# Patient Record
Sex: Female | Born: 1970
Health system: Southern US, Community
[De-identification: ages and names within clinical notes are randomized; demographics above are authoritative.]

## PROBLEM LIST (undated history)

## (undated) DIAGNOSIS — D649 Anemia, unspecified: Secondary | ICD-10-CM

## (undated) DIAGNOSIS — T4145XA Adverse effect of unspecified anesthetic, initial encounter: Secondary | ICD-10-CM

## (undated) DIAGNOSIS — E039 Hypothyroidism, unspecified: Secondary | ICD-10-CM

## (undated) DIAGNOSIS — K219 Gastro-esophageal reflux disease without esophagitis: Secondary | ICD-10-CM

## (undated) DIAGNOSIS — T8859XA Other complications of anesthesia, initial encounter: Secondary | ICD-10-CM

## (undated) HISTORY — PX: WISDOM TOOTH EXTRACTION: SHX21

## (undated) HISTORY — PX: MYOMECTOMY: SHX85

## (undated) HISTORY — PX: APPENDECTOMY: SHX54

## (undated) HISTORY — PX: OTHER SURGICAL HISTORY: SHX169

## (undated) HISTORY — PX: CHOLECYSTECTOMY: SHX55

## (undated) HISTORY — PX: ABDOMINAL HYSTERECTOMY: SHX81

## (undated) HISTORY — PX: KNEE ARTHROSCOPY: SUR90

---

## 1997-12-31 ENCOUNTER — Other Ambulatory Visit: Admission: RE | Admit: 1997-12-31 | Discharge: 1997-12-31 | Payer: Self-pay | Admitting: *Deleted

## 1998-12-29 ENCOUNTER — Other Ambulatory Visit: Admission: RE | Admit: 1998-12-29 | Discharge: 1998-12-29 | Payer: Self-pay | Admitting: Obstetrics and Gynecology

## 1999-11-07 ENCOUNTER — Inpatient Hospital Stay (HOSPITAL_COMMUNITY): Admission: EM | Admit: 1999-11-07 | Discharge: 1999-11-09 | Payer: Self-pay | Admitting: Emergency Medicine

## 1999-11-07 ENCOUNTER — Encounter: Payer: Self-pay | Admitting: Emergency Medicine

## 1999-11-07 ENCOUNTER — Encounter: Payer: Self-pay | Admitting: Internal Medicine

## 1999-12-28 ENCOUNTER — Encounter (INDEPENDENT_AMBULATORY_CARE_PROVIDER_SITE_OTHER): Payer: Self-pay

## 1999-12-28 ENCOUNTER — Other Ambulatory Visit: Admission: RE | Admit: 1999-12-28 | Discharge: 1999-12-28 | Payer: Self-pay | Admitting: *Deleted

## 2000-01-12 ENCOUNTER — Inpatient Hospital Stay (HOSPITAL_COMMUNITY): Admission: RE | Admit: 2000-01-12 | Discharge: 2000-01-14 | Payer: Self-pay | Admitting: *Deleted

## 2000-01-12 ENCOUNTER — Encounter (INDEPENDENT_AMBULATORY_CARE_PROVIDER_SITE_OTHER): Payer: Self-pay

## 2000-02-23 ENCOUNTER — Other Ambulatory Visit: Admission: RE | Admit: 2000-02-23 | Discharge: 2000-02-23 | Payer: Self-pay | Admitting: *Deleted

## 2000-03-22 ENCOUNTER — Encounter: Admission: RE | Admit: 2000-03-22 | Discharge: 2000-03-22 | Payer: Self-pay | Admitting: Orthopedic Surgery

## 2000-03-22 ENCOUNTER — Encounter: Payer: Self-pay | Admitting: Orthopedic Surgery

## 2000-10-04 ENCOUNTER — Encounter (INDEPENDENT_AMBULATORY_CARE_PROVIDER_SITE_OTHER): Payer: Self-pay | Admitting: Specialist

## 2000-10-05 ENCOUNTER — Inpatient Hospital Stay (HOSPITAL_COMMUNITY): Admission: EM | Admit: 2000-10-05 | Discharge: 2000-10-06 | Payer: Self-pay | Admitting: *Deleted

## 2002-05-12 ENCOUNTER — Other Ambulatory Visit: Admission: RE | Admit: 2002-05-12 | Discharge: 2002-05-12 | Payer: Self-pay | Admitting: *Deleted

## 2004-11-17 ENCOUNTER — Encounter: Admission: RE | Admit: 2004-11-17 | Discharge: 2004-11-17 | Payer: Self-pay | Admitting: Specialist

## 2007-02-26 ENCOUNTER — Other Ambulatory Visit: Admission: RE | Admit: 2007-02-26 | Discharge: 2007-02-26 | Payer: Self-pay | Admitting: Family Medicine

## 2007-09-13 ENCOUNTER — Encounter: Admission: RE | Admit: 2007-09-13 | Discharge: 2007-09-13 | Payer: Self-pay | Admitting: Surgery

## 2009-09-29 ENCOUNTER — Other Ambulatory Visit: Admission: RE | Admit: 2009-09-29 | Discharge: 2009-09-29 | Payer: Self-pay | Admitting: Family Medicine

## 2009-09-30 ENCOUNTER — Ambulatory Visit (HOSPITAL_COMMUNITY): Admission: RE | Admit: 2009-09-30 | Discharge: 2009-09-30 | Payer: Self-pay | Admitting: Family Medicine

## 2009-12-07 ENCOUNTER — Other Ambulatory Visit: Admission: RE | Admit: 2009-12-07 | Discharge: 2009-12-07 | Payer: Self-pay | Admitting: Family Medicine

## 2011-02-15 ENCOUNTER — Other Ambulatory Visit (HOSPITAL_COMMUNITY): Payer: Self-pay | Admitting: Family Medicine

## 2011-02-15 DIAGNOSIS — Z1231 Encounter for screening mammogram for malignant neoplasm of breast: Secondary | ICD-10-CM

## 2011-02-17 ENCOUNTER — Ambulatory Visit (HOSPITAL_COMMUNITY): Payer: 59

## 2011-02-17 NOTE — H&P (Signed)
[facility]  Patient:    Kristen Chan, Kristen Chan                          MRN: [id number] Adm. Date:  10/05/00 Attending:  Angelia Mould. Derrell Lolling, M.D. CC:         Georgina Peer, M.D.   History and Physical  CHIEF COMPLAINT:  Lower abdominal pain and nausea.  HISTORY OF PRESENT ILLNESS:  This is a 40 year old white female who is generally in excellent health.  She noted the onset of lower abdominal pain, more so in the right lower quadrant at about 9 a.m. on October 04, 2000.  She thought the pain was cramping, bothering her all day, she has some lower back pain and had nausea.  She developed some bloating and felt this was similar to the pain that she had a year ago when she had uterine fibroid problems.  She now states that she has pain across her entire lower abdomen but much more so in the right lower quadrant and that occasionally she will have brief shooting pains as well.  She has been nauseated all day.  She vomited once after drinking contrast for the CT.  No change in bowel habits.  Denies chills or fever.  I was asked to see her by ______ and Dr. Smitty Cords. Cheek in the ER.  PAST HISTORY:  She had a laparoscopic cholecystectomy by Dr. Huey Bienenstock. Gerkin in 1998.  She had a uterine myomectomy with removal of a single large fibroid by Dr. Georgina Peer in April of 2001.  She had an arthroscopy of her left knee in July of 2001.  No other medical or surgical problems.  CURRENT MEDICATIONS:  Birth control pills.  DRUG ALLERGIES:  None known.  FAMILY HISTORY:  Mother living, has a heart murmur and borderline diabetes. Father is deceased; had alcoholic cirrhosis.  Coronary artery disease in the maternal grandparents.  One brother who is living and well.  SOCIAL HISTORY:  The patient is married and has two children.  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  Denies the use of alcohol or tobacco.  REVIEW OF SYSTEMS:  All systems are reviewed and are  negative except as described above.  PHYSICAL EXAMINATION  GENERAL:  A pleasant, thin young woman who appears in moderate distress, overall appears healthy.  VITAL SIGNS:  Blood pressure 110/71, pulse 121, respirations 18, temperature 97.6.  HEENT:  Sclerae clear.  Extraocular movements intact.  Oropharynx clear.  NECK:  Supple, nontender.  No mass.  No adenopathy.  No bruit.  LUNGS:  Clear to auscultation.  HEART:  Regular rate and rhythm.  No murmur.  BREASTS:  Not examined.  ABDOMEN:  Hypoactive bowel sounds.  Slightly distended in the lower abdomen. Well-healed infraumbilical scar.  Well-healed suprapubic transverse scar.  No herniae or masses noted.  She is tender and guards across the entire lower abdomen but subjectively is more tender on the right lower quadrant.  No masses detected.  She has percussion tenderness across the entire lower abdomen, again slightly more in the right lower quadrant, not dramatically focal, however.  EXTREMITIES:  Free range of motion.  No deformity.  No edema.  Good pulses.  NEUROLOGIC:  Grossly within normal limits.  ADMISSION DATA:  White blood cell count 22,000, hemoglobin 11.3.  Complete metabolic panel normal.  CT scan suggests appendicitis.  The cecum is very low lying in the pelvis and the  appendix appears to be down in the pelvis on top of the it to the right of the uterus but appears markedly distended.  There is no free air.  There is no abscess.  This was a somewhat difficult CT scan, requiring rectal contrast and IV contrast and after a lengthy discussion with ______ , he feels that the patient has appendicitis and doubts that there is any uterine or ovarian pathology.  IMPRESSION:  Lower abdominal pain and leukocytosis.  Appendicitis seems to be the most likely diagnosis.  Other considerations would be unusual tuboovarian abscess, infarcted pedunculated uterine fibroid, unlikely, gastroenteritis, atypical diverticulitis,  etc.  PLAN:  I advised the patient and her husband that she should undergo diagnostic laparoscopy and probable appendectomy.  I have discussed the differential diagnosis with them.  Details of the procedure were discussed with them.  Risks and complications were outlined including, but not limited to, bleeding, infection, intestinal perforation, conversion to open laparotomy, injury to the gynecologic or bladder organs and other unforeseen complications.  They seem to understand all these issues well.  At this time, all of the questions are answered.  They agree with this plan. DD:  10/05/00 TD:  10/05/00 Job: [id number]

## 2011-02-17 NOTE — Op Note (Signed)
[facility]  Patient:    Kristen Chan, Kristen Chan                        MRN: [id number] Proc. Date: 01/12/00 Adm. Date:  [id number] Attending:  Pleas Koch                           Operative Report  PREOPERATIVE DIAGNOSIS:  Symptomatic uterine fibroid, menorrhagia and anemia.  POSTOPERATIVE DIAGNOSIS:  Symptomatic uterine fibroid, menorrhagia and anemia.  OPERATION:  Abdominal myomectomy, placement of Alpha 2000 catheter pain management system.  SURGEON:  Georgina Peer, M.D.  ASSISTANT:  Henreitta Leber, P.A.  ANESTHESIA:  General.  ANESTHESIOLOGIST:  Dr. Jean Rosenthal.  ESTIMATED BLOOD LOSS:  100 cc.  COMPLICATIONS:  None.  INDICATIONS:  A 40 year old gravida 2, para 2 with a symptomatic fibroid, heavy bleeding with resultant anemia.  It was brought in for myomectomy.  For details of the history, please see history and physical.  DESCRIPTION OF PROCEDURE:  The patient was taken to the operating room and given a general anesthetic, placed in dorsal lithotomy position.   After abdomen, vagina and perineum were prepped and draped, catheter placed in her bladder.  A Pfannenstiel incision was made with transverse incision through the skin taking it into the peritoneal cavity in the normal manner.  Bleeders were cauterized.  The peritoneal cavity was opened and widened.  The uterus was mobile with the posterior fibroid approximately 5 x 7 cm in diameter consistent with what the ultrasound imaged.  The uterus was elevated out of the incision.  There was no other pathology noted.  The tubes and ovaries appeared normal and were covered with moist sponges and kept irrigated.  A dilute solution of Pitressin 20 units in 100 cc total of 20 cc was injected into the posterior midline and then using the needle point Bovie cautery an incision was made in the serosa down to the fibroid.  Bleeders were cauterized.  The fibroid was isolated and dissected free by  sharp and blunt dissection, the endometrial cavity did not appear to be entered.  The myometrial defect was then closed in multiple layers using 0 and 2-0 Vicryl in interrupted figure-of-eight and mattress sutures.  The serosa was closed with an inverted baseball stitch.  Pressure was applied and there was good hemostasis.  Interceed was placed over the incision separating it from the ovaries and the bowel.  The uterus was then placed in its normal anatomic position.  All blood and debris was suction suctioned away.  Instruments were removed.  The peritoneal edges ____________ the muscle and fascia were inspected for bleeding and there was none.  The fascia was then closed with 0 Vicryl continuous from each end and tied in the middle.  Subcutaneous tissue was cauterized for any bleeding.  The Alpha 2000 catheter was placed three fingerbreadths above the right edge of the incision.  15 cc of 0.25% Marcaine was used to infiltrate the skin and subcutaneous tissues.  The skin was closed with subcuticular Dexon suture and Steri-Strips with benzoin.  The Alpha 2000 system was kept in place with Tegaderm.  Sponge, needle and instrument counts were correct.  The patient tolerated the procedure well and was transferred to the recovery area in stable condition. DD:  01/12/00 TD:  01/12/00 Job: [id number]

## 2011-02-17 NOTE — Discharge Summary (Signed)
[facility]  Patient:    Kristen Chan, Kristen Chan                        MRN: [id number] Adm. Date:  [id number] Disc. Date: 01/14/00 Attending:  Pleas Koch                           Discharge Summary  ADMITTING DIAGNOSES:          Symptomatic fibroid menorrhagia and anemia.  DISCHARGE DIAGNOSES:          Symptomatic fibroid menorrhagia and anemia.  BRIEF HISTORY:                A 40 year old gravida 2, para 2 with a history of a symptomatic 7 cm fibroid which caused heavy bleeding and resultant anemia.  The  patient underwent an abdominal myomectomy on January 12, 2000 under general anesthesia.  She did well.  There was a posterior 7 cm myoma.  HOSPITAL COURSE:              Postoperatively, the patient was doing well.  She had good urinary output and good bowel sounds.  There was some drainage from the incision which was closed with a subcuticular stitch initially.  It was thought  that this was serosanguineous and most likely due to retained irrigation fluid rom the operation.  It stopped spontaneously and the patients hemoglobin the next morning was 10.4 from a preop of 11.6.  She had good bowel sounds and on the first day she was voiding without difficulty.  She was passing flatus and advanced to a regular diet.  She was ambulating and requiring only oral pain medication.  The  ___ 2000 pump was working well in the incision. The incision was clean and dry.  Denna Haggard was negative and lungs were clear.  She was discharged on the second postoperative day in good condition, having received the maximum benefit from the hospitalization.                               She will be discharged with no heavy lifting. Darvocet-N 100 for pain, Tylenol or Advil.  No driving and to return to the office on April 16 or January 17, 2000 for ___ 2000 pump removal. DD:  01/14/00 TD:  01/14/00 Job: [id number]

## 2011-02-17 NOTE — Op Note (Signed)
[facility]  Patient:    Kristen Chan, Kristen Chan                        MRN: [id number] Proc. Date: 10/05/00 Adm. Date:  [id number] Attending:  Brandy Hale                           Operative Report  PREOPERATIVE DIAGNOSIS:  Acute appendicitis.  POSTOPERATIVE DIAGNOSIS:  Acute suppurative appendicitis.  OPERATION:  Laparoscopic appendectomy.  SURGEON:  Angelia Mould. Derrell Lolling, M.D.  OPERATIVE INDICATIONS:  This is a 40 year old, white female, with lower abdominal pain since 9 a.m. on October 04, 2000.  She has been nauseated.  She vomited once in the emergency room.  On examination, she has diffuse lower abdominal tenderness, although she localizes more to the right than to the left subjectively.  No point tenderness, however.  CT scan shows a very low lying cecum and suggest a thickened appendix in the pelvis near the uterus. There is no abscess or free air.  She is brought to the operating room for diagnostic laparoscopy and appendectomy.  OPERATIVE FINDINGS:  The appendix was acutely inflamed and had exudate on it. There was no abscess and no rupture.  The appendix was deep within the pelvis. The cecum was very low and was down essentially in the cul-de-sac near the uterus.  The uterus looked slightly enlarged but otherwise normal.  There was no obvious adnexal disease.  The small intestine and large intestine were grossly normal.  The liver and stomach looked normal.  The gallbladder was surgically absent.  OPERATIVE TECHNIQUE:  Following the induction of general endotracheal anesthesia, the patients abdomen was prepped and draped in a sterile fashion. Marcaine 0.25% with epinephrine was used as a local infiltration anesthetic. A transverse incision was made below the umbilicus through a previous laparoscopic scar.  The fascia was incised in the midline and the abdominal cavity entered under direct vision.  A 10 mm Hasson trocar was inserted  and secured with a pursestring suture of 0 Vicryl.  Pneumoperitoneum was created. A video camera was inserted with visualization and findings as described above.  The 5 mm trocar was placed into the right upper quadrant and a 12 mm trocar placed in the left lower quadrant.  The patient was placed in steep Trendelenburg which allowed [place] to mobilize the cecum and small bowel out of the pelvis.  We identified the appendix and took down some adhesions.  We encircled the appendix with a chromic Endoloop tie for mobilization.  Using the harmonic scalpel we divided some of the peritoneal attachments and fully mobilized the appendix.  The harmonic scalpel was used to divide the mesoappendix and appendiceal artery.  In this manner we dissected the appendix up and completely free of all of its attachments until we could clearly identify the junction of the appendix with the cecum.  The appendix was transected at the cecum with an Endo GIA stapling device.  The appendix was placed in a specimen bag and removed.  The operative field was copiously irrigated with saline.  We irrigated the pelvis in the right pericolic gutter and there was no bleeding whatsoever.  The staple line and the cecum looked very good.  The trocars were removed under direct vision and there was no bleeding from the trocars sites.  The pneumoperitoneum was loose.  The fascia at the umbilicus and fascia at the left  lower quadrant trocar site were closed with 0 Vicryl sutures.  The incisions were irrigated with saline, and the skin closed with subcuticular sutures of 4-0 Vicryl and Steri-Strips.  Clean bandages were placed and the patient was taken to the recovery room in stable condition.  Estimated blood loss was about 15-20 cc.  Complications none. Sponge, needle, and instrument counts were correct. DD:  10/05/00 TD:  10/05/00 Job: [id number]

## 2011-02-23 ENCOUNTER — Ambulatory Visit (HOSPITAL_COMMUNITY): Payer: 59

## 2011-03-08 ENCOUNTER — Ambulatory Visit (HOSPITAL_COMMUNITY): Admission: RE | Admit: 2011-03-08 | Payer: 59 | Source: Ambulatory Visit

## 2011-03-17 ENCOUNTER — Ambulatory Visit (HOSPITAL_COMMUNITY): Payer: 59

## 2011-03-17 ENCOUNTER — Ambulatory Visit (HOSPITAL_COMMUNITY)
Admission: RE | Admit: 2011-03-17 | Discharge: 2011-03-17 | Disposition: A | Discharge status: Home / Self Care | Payer: PRIVATE HEALTH INSURANCE | Source: Ambulatory Visit | Attending: Family Medicine | Admitting: Family Medicine

## 2011-03-17 DIAGNOSIS — Z1231 Encounter for screening mammogram for malignant neoplasm of breast: Secondary | ICD-10-CM

## 2012-02-27 ENCOUNTER — Other Ambulatory Visit (HOSPITAL_COMMUNITY): Payer: Self-pay | Admitting: Family Medicine

## 2012-02-27 DIAGNOSIS — Z1231 Encounter for screening mammogram for malignant neoplasm of breast: Secondary | ICD-10-CM

## 2012-03-20 ENCOUNTER — Ambulatory Visit (HOSPITAL_COMMUNITY): Payer: PRIVATE HEALTH INSURANCE

## 2012-04-03 ENCOUNTER — Other Ambulatory Visit: Payer: Self-pay | Admitting: Family Medicine

## 2012-04-03 ENCOUNTER — Other Ambulatory Visit (HOSPITAL_COMMUNITY)
Admission: RE | Admit: 2012-04-03 | Discharge: 2012-04-03 | Disposition: A | Discharge status: Home / Self Care | Payer: 59 | Source: Ambulatory Visit | Attending: Family Medicine | Admitting: Family Medicine

## 2012-04-03 DIAGNOSIS — Z124 Encounter for screening for malignant neoplasm of cervix: Secondary | ICD-10-CM | POA: Insufficient documentation

## 2012-04-22 ENCOUNTER — Ambulatory Visit (HOSPITAL_COMMUNITY)
Admission: RE | Admit: 2012-04-22 | Discharge: 2012-04-22 | Disposition: A | Discharge status: Home / Self Care | Payer: 59 | Source: Ambulatory Visit | Attending: Family Medicine | Admitting: Family Medicine

## 2012-04-22 DIAGNOSIS — Z1231 Encounter for screening mammogram for malignant neoplasm of breast: Secondary | ICD-10-CM | POA: Insufficient documentation

## 2012-04-24 ENCOUNTER — Other Ambulatory Visit: Payer: Self-pay | Admitting: Family Medicine

## 2012-04-24 DIAGNOSIS — R928 Other abnormal and inconclusive findings on diagnostic imaging of breast: Secondary | ICD-10-CM

## 2012-04-30 ENCOUNTER — Ambulatory Visit
Admission: RE | Admit: 2012-04-30 | Discharge: 2012-04-30 | Disposition: A | Discharge status: Home / Self Care | Payer: 59 | Source: Ambulatory Visit | Attending: Family Medicine | Admitting: Family Medicine

## 2012-04-30 DIAGNOSIS — R928 Other abnormal and inconclusive findings on diagnostic imaging of breast: Secondary | ICD-10-CM

## 2013-05-09 ENCOUNTER — Other Ambulatory Visit: Payer: Self-pay | Admitting: Obstetrics and Gynecology

## 2014-05-14 ENCOUNTER — Other Ambulatory Visit: Payer: Self-pay | Admitting: *Deleted

## 2014-05-14 ENCOUNTER — Ambulatory Visit (INDEPENDENT_AMBULATORY_CARE_PROVIDER_SITE_OTHER): Payer: 59 | Admitting: Physician Assistant

## 2014-05-14 DIAGNOSIS — R079 Chest pain, unspecified: Secondary | ICD-10-CM

## 2014-05-14 NOTE — Progress Notes (Signed)
Exercise Treadmill Test  Kristen Chan is a 42 y.o. female non-smoker with a FHx of CAD referred by PCP for ETT due to recent hx of R sided chest discomfort occuring at rest with assoc jaw pain and lightheadedness.  No exertional chest pain, dyspnea, syncope.  Exam unremarkable.  ECG without ST changes.  Pre-Exercise Testing Evaluation Rhythm: normal sinus  Rate: 88 bpm     Test  Exercise Tolerance Test Ordering MD: Cristopher Peru, MD  Interpreting MD: Richardson Dopp, PA-C  Unique Test No: 1  Treadmill:  1  Indication for ETT: chest pain - rule out ischemia  Contraindication to ETT: No   Stress Modality: exercise - treadmill  Cardiac Imaging Performed: non   Protocol: standard Bruce - maximal  Max BP:  169/78  Max MPHR (bpm):  175 85% MPR (bpm):  149  MPHR obtained (bpm):  171 % MPHR obtained:  97  Reached 85% MPHR (min:sec):  4:06 Total Exercise Time (min-sec):  8:00  Workload in METS:  10.0 Borg Scale: 13  Reason ETT Terminated:  desired heart rate attained    ST Segment Analysis At Rest: normal ST segments - no evidence of significant ST depression With Exercise: no evidence of significant ST depression  Other Information Arrhythmia:  No Angina during ETT:  absent (0) Quality of ETT:  diagnostic  ETT Interpretation:  normal - no evidence of ischemia by ST analysis  Comments: Good exercise capacity. No chest pain. Normal BP response to exercise. No ST  changes to suggest ischemia.   Recommendations: FU with PCP as directed. Signed,  Richardson Dopp, PA-C   05/14/2014 10:06 AM

## 2015-02-12 ENCOUNTER — Other Ambulatory Visit: Payer: Self-pay | Admitting: Obstetrics and Gynecology

## 2015-02-22 ENCOUNTER — Encounter (HOSPITAL_COMMUNITY)
Admission: RE | Admit: 2015-02-22 | Discharge: 2015-02-22 | Disposition: A | Discharge status: Home / Self Care | Payer: 59 | Source: Ambulatory Visit | Attending: Obstetrics and Gynecology | Admitting: Obstetrics and Gynecology

## 2015-02-22 ENCOUNTER — Encounter (HOSPITAL_COMMUNITY): Payer: Self-pay

## 2015-02-22 DIAGNOSIS — D259 Leiomyoma of uterus, unspecified: Secondary | ICD-10-CM | POA: Diagnosis not present

## 2015-02-22 DIAGNOSIS — Z9049 Acquired absence of other specified parts of digestive tract: Secondary | ICD-10-CM | POA: Diagnosis not present

## 2015-02-22 DIAGNOSIS — E039 Hypothyroidism, unspecified: Secondary | ICD-10-CM | POA: Diagnosis not present

## 2015-02-22 DIAGNOSIS — Z880 Allergy status to penicillin: Secondary | ICD-10-CM | POA: Diagnosis not present

## 2015-02-22 DIAGNOSIS — N800 Endometriosis of uterus: Secondary | ICD-10-CM | POA: Diagnosis not present

## 2015-02-22 DIAGNOSIS — K219 Gastro-esophageal reflux disease without esophagitis: Secondary | ICD-10-CM | POA: Diagnosis not present

## 2015-02-22 DIAGNOSIS — Z882 Allergy status to sulfonamides status: Secondary | ICD-10-CM | POA: Diagnosis not present

## 2015-02-22 HISTORY — DX: Other complications of anesthesia, initial encounter: T88.59XA

## 2015-02-22 HISTORY — DX: Adverse effect of unspecified anesthetic, initial encounter: T41.45XA

## 2015-02-22 HISTORY — DX: Anemia, unspecified: D64.9

## 2015-02-22 HISTORY — DX: Gastro-esophageal reflux disease without esophagitis: K21.9

## 2015-02-22 HISTORY — DX: Hypothyroidism, unspecified: E03.9

## 2015-02-22 LAB — CBC
HEMATOCRIT: 40.9 % (ref 36.0–46.0)
HEMOGLOBIN: 13.8 g/dL (ref 12.0–15.0)
MCH: 32.3 pg (ref 26.0–34.0)
MCHC: 33.7 g/dL (ref 30.0–36.0)
MCV: 95.8 fL (ref 78.0–100.0)
PLATELETS: 334 10*3/uL (ref 150–400)
RBC: 4.27 MIL/uL (ref 3.87–5.11)
RDW: 13.2 % (ref 11.5–15.5)
WBC: 7.5 10*3/uL (ref 4.0–10.5)

## 2015-02-22 NOTE — H&P (Signed)
44 y.o. yo complains of symptomatic fibroid uterus.  Previously:Pt. c/o intermittent sharp stabbing R&LLQ pain started 5 weeks ago, has hx of ovarian cysts; also c/o spotting and nausea, bloating, constipation.  US showed 8x8x7 with at least 5 fibroids 1-4 cm. EM 0.3. RO 2 cm and LO not seen.  Pt has been suffering with pain since starting OCPs- still has pain during when cycle "should be on" despite continuous. Then a month ago began bleeding again despite not missing pills. Fibroids seem to have returned and not responding to therapy. Pt desires definitive after all options discussed. Vulvar area flares on occ and clobetasol for a day takes care of it. Current US shows uterus 9x8x7, multiple fibroids. EM 0.3 cm; normal R ovary, LO not seen.  Past Medical History  Diagnosis Date  . Complication of anesthesia     patient requests lightweight anesthesia  . Hypothyroidism   . GERD (gastroesophageal reflux disease)   . Anemia     history of anemia   Past Surgical History  Procedure Laterality Date  . Cholecystectomy    . Appendectomy    . Myomectomy    . Knee arthroscopy    . Thumb joint stabiliztion    . Wisdom tooth extraction      History   Social History  . Marital Status: Married    Spouse Name: N/A  . Number of Children: N/A  . Years of Education: N/A   Occupational History  . Not on file.   Social History Main Topics  . Smoking status: Never Smoker   . Smokeless tobacco: Never Used  . Alcohol Use: No  . Drug Use: No  . Sexual Activity: Not on file   Other Topics Concern  . Not on file   Social History Narrative  . No narrative on file    No current facility-administered medications on file prior to encounter.   No current outpatient prescriptions on file prior to encounter.    Allergies  Allergen Reactions  . Artichoke [Cynara Scolymus (Artichoke)] Itching and Swelling    Throat swelling and itching, caused by severe poison ivy allergy.  Luretha Rued Flavor  Itching and Swelling    Throat swelling and itching, caused by severe poison ivy allergy.   . Penicillins Rash  . Sulfa Antibiotics Rash    @VITALS2 @  Lungs: clear to ascultation Cor:  RRR Abdomen:  soft, nontender, nondistended. Ex:  no cords, erythema Pelvic:  Female Genitalia: Vulva: no masses, atrophy, or lesions; no redness in area of itching. Vagina: no tenderness, erythema, cystocele, rectocele, abnormal vaginal discharge, or vesicle(s) or ulcers. Cervix: no discharge or cervical motion tenderness and grossly normal. Uterus: normal shape and size 10 and non-tender, no uterine prolapse, and retroverted. Bladder/Urethra: no urethral discharge or mass and normal meatus, bladder non distended, and Urethra well supported. Adnexa/Parametria: no parametrial tenderness or mass and no adnexal tenderness or ovarian mass.  A:  Symptomatic fibroid uterus.  For robotic TLH/salpingectomies/possible BSO- pt desires to lean toward taking ovaries and will do so if any endometriosis or cysts.  Pt desires Ultram for post op pain.   P: All risks, benefits and alternatives d/w patient and she desires to proceed.  Patient has undergone a modified bowel prep and will receive preop antibiotics and SCDs during the operation.     Halyn Flaugher A

## 2015-02-22 NOTE — Patient Instructions (Signed)
Your procedure is scheduled on:  Wednesday, Feb 24, 2015  Enter through the [employer] of [facility] at:  6:00 A.M.  Pick up the phone at the desk and dial [phone].  Call this number if you have problems the morning of surgery: [phone].  Remember: Do NOT eat food or drink after:  AFTER MIDNIGHT TUESDAY Take these medicines the morning of surgery with a SIP OF WATER: LEVOTHYROXINE, OMEPRAZOLE  Do NOT wear jewelry (body piercing), metal hair clips/bobby pins, make-up, or nail polish. Do NOT wear lotions, powders, or perfumes.  You may wear deoderant. Do NOT shave for 48 hours prior to surgery. Do NOT bring valuables to the hospital. Contacts, dentures, or bridgework may not be worn into surgery. Leave suitcase in car.  After surgery it may be brought to your room.  For patients admitted to the hospital, checkout time is 11:00 AM the day of discharge.

## 2015-02-23 MED ORDER — CIPROFLOXACIN IN D5W 400 MG/200ML IV SOLN
400.0000 mg | INTRAVENOUS | Status: AC
  Administered 2015-02-24: 400 mg via INTRAVENOUS
  Filled 2015-02-23: qty 200

## 2015-02-23 MED ORDER — METRONIDAZOLE IN NACL 5-0.79 MG/ML-% IV SOLN
500.0000 mg | INTRAVENOUS | Status: AC
  Administered 2015-02-24: 500 mg via INTRAVENOUS
  Filled 2015-02-23: qty 100

## 2015-02-24 ENCOUNTER — Encounter (HOSPITAL_COMMUNITY): Payer: Self-pay | Admitting: Anesthesiology

## 2015-02-24 ENCOUNTER — Ambulatory Visit (HOSPITAL_COMMUNITY): Payer: 59 | Admitting: Anesthesiology

## 2015-02-24 ENCOUNTER — Ambulatory Visit (HOSPITAL_COMMUNITY)
Admission: RE | Admit: 2015-02-24 | Discharge: 2015-02-25 | Disposition: A | Discharge status: Home / Self Care | Payer: 59 | Source: Ambulatory Visit | Attending: Obstetrics and Gynecology | Admitting: Obstetrics and Gynecology

## 2015-02-24 ENCOUNTER — Encounter (HOSPITAL_COMMUNITY)
Admission: RE | Disposition: A | Discharge status: Home / Self Care | Payer: Self-pay | Source: Ambulatory Visit | Attending: Obstetrics and Gynecology

## 2015-02-24 DIAGNOSIS — Z9049 Acquired absence of other specified parts of digestive tract: Secondary | ICD-10-CM | POA: Insufficient documentation

## 2015-02-24 DIAGNOSIS — D259 Leiomyoma of uterus, unspecified: Secondary | ICD-10-CM | POA: Insufficient documentation

## 2015-02-24 DIAGNOSIS — Z880 Allergy status to penicillin: Secondary | ICD-10-CM | POA: Insufficient documentation

## 2015-02-24 DIAGNOSIS — Z882 Allergy status to sulfonamides status: Secondary | ICD-10-CM | POA: Insufficient documentation

## 2015-02-24 DIAGNOSIS — E039 Hypothyroidism, unspecified: Secondary | ICD-10-CM | POA: Insufficient documentation

## 2015-02-24 DIAGNOSIS — Z9889 Other specified postprocedural states: Secondary | ICD-10-CM

## 2015-02-24 DIAGNOSIS — N800 Endometriosis of uterus: Secondary | ICD-10-CM | POA: Insufficient documentation

## 2015-02-24 DIAGNOSIS — K219 Gastro-esophageal reflux disease without esophagitis: Secondary | ICD-10-CM | POA: Insufficient documentation

## 2015-02-24 HISTORY — PX: ROBOTIC ASSISTED BILATERAL SALPINGO OOPHERECTOMY: SHX6078

## 2015-02-24 HISTORY — PX: CYSTOSCOPY: SHX5120

## 2015-02-24 LAB — PREGNANCY, URINE: Preg Test, Ur: NEGATIVE

## 2015-02-24 SURGERY — ROBOTIC ASSISTED BILATERAL SALPINGO OOPHORECTOMY
Anesthesia: General | Site: Urethra

## 2015-02-24 MED ORDER — MIDAZOLAM HCL 2 MG/2ML IJ SOLN
INTRAMUSCULAR | Status: DC | PRN
  Administered 2015-02-24: 2 mg via INTRAVENOUS

## 2015-02-24 MED ORDER — GLYCOPYRROLATE 0.2 MG/ML IJ SOLN
INTRAMUSCULAR | Status: AC
  Filled 2015-02-24: qty 4

## 2015-02-24 MED ORDER — ONDANSETRON HCL 4 MG PO TABS: 4.0000 mg | ORAL_TABLET | Freq: Four times a day (QID) | ORAL | Status: DC | PRN

## 2015-02-24 MED ORDER — STERILE WATER FOR IRRIGATION IR SOLN
Status: DC | PRN
  Administered 2015-02-24: 1000 mL
  Administered 2015-02-24: 3000 mL

## 2015-02-24 MED ORDER — FENTANYL CITRATE (PF) 250 MCG/5ML IJ SOLN
INTRAMUSCULAR | Status: AC
  Filled 2015-02-24: qty 5

## 2015-02-24 MED ORDER — GLYCOPYRROLATE 0.2 MG/ML IJ SOLN
INTRAMUSCULAR | Status: AC
  Filled 2015-02-24: qty 1

## 2015-02-24 MED ORDER — PROPOFOL 10 MG/ML IV BOLUS
INTRAVENOUS | Status: AC
  Filled 2015-02-24: qty 20

## 2015-02-24 MED ORDER — TRAMADOL HCL 50 MG PO TABS
50.0000 mg | ORAL_TABLET | Freq: Four times a day (QID) | ORAL | Status: DC | PRN
  Administered 2015-02-24 – 2015-02-25 (×4): 50 mg via ORAL
  Filled 2015-02-24 (×4): qty 1

## 2015-02-24 MED ORDER — MEPERIDINE HCL 25 MG/ML IJ SOLN: 6.2500 mg | INTRAMUSCULAR | Status: DC | PRN

## 2015-02-24 MED ORDER — LACTATED RINGERS IV SOLN
INTRAVENOUS | Status: DC
  Administered 2015-02-24 (×2): via INTRAVENOUS

## 2015-02-24 MED ORDER — HYDROMORPHONE HCL 1 MG/ML IJ SOLN
INTRAMUSCULAR | Status: AC
  Filled 2015-02-24: qty 1

## 2015-02-24 MED ORDER — DEXAMETHASONE SODIUM PHOSPHATE 4 MG/ML IJ SOLN
INTRAMUSCULAR | Status: AC
Start: 2015-02-24 — End: 2015-02-24
  Filled 2015-02-24: qty 1

## 2015-02-24 MED ORDER — FENTANYL CITRATE (PF) 100 MCG/2ML IJ SOLN
INTRAMUSCULAR | Status: DC | PRN
  Administered 2015-02-24: 100 ug via INTRAVENOUS
  Administered 2015-02-24 (×3): 50 ug via INTRAVENOUS

## 2015-02-24 MED ORDER — IBUPROFEN 800 MG PO TABS
800.0000 mg | ORAL_TABLET | Freq: Three times a day (TID) | ORAL | Status: DC | PRN
  Administered 2015-02-24 – 2015-02-25 (×3): 800 mg via ORAL
  Filled 2015-02-24 (×3): qty 1

## 2015-02-24 MED ORDER — GLYCOPYRROLATE 0.2 MG/ML IJ SOLN
INTRAMUSCULAR | Status: DC | PRN
  Administered 2015-02-24: 0.2 mg via INTRAVENOUS
  Administered 2015-02-24: .8 mg via INTRAVENOUS

## 2015-02-24 MED ORDER — METOCLOPRAMIDE HCL 5 MG/ML IJ SOLN
INTRAMUSCULAR | Status: AC
  Filled 2015-02-24: qty 2

## 2015-02-24 MED ORDER — PROPOFOL 10 MG/ML IV BOLUS
INTRAVENOUS | Status: DC | PRN
  Administered 2015-02-24: 150 mg via INTRAVENOUS

## 2015-02-24 MED ORDER — ONDANSETRON HCL 4 MG/2ML IJ SOLN
4.0000 mg | Freq: Four times a day (QID) | INTRAMUSCULAR | Status: DC | PRN
  Administered 2015-02-24: 4 mg via INTRAVENOUS
  Filled 2015-02-24: qty 2

## 2015-02-24 MED ORDER — NEOSTIGMINE METHYLSULFATE 10 MG/10ML IV SOLN
INTRAVENOUS | Status: DC | PRN
  Administered 2015-02-24: 5 mg via INTRAVENOUS

## 2015-02-24 MED ORDER — ROCURONIUM BROMIDE 100 MG/10ML IV SOLN
INTRAVENOUS | Status: AC
  Filled 2015-02-24: qty 1

## 2015-02-24 MED ORDER — KETOROLAC TROMETHAMINE 30 MG/ML IJ SOLN
INTRAMUSCULAR | Status: DC | PRN
  Administered 2015-02-24: 30 mg via INTRAVENOUS

## 2015-02-24 MED ORDER — LEVOTHYROXINE SODIUM 50 MCG PO TABS
50.0000 ug | ORAL_TABLET | Freq: Every day | ORAL | Status: DC
  Administered 2015-02-25: 50 ug via ORAL
  Filled 2015-02-24: qty 1

## 2015-02-24 MED ORDER — ROCURONIUM BROMIDE 100 MG/10ML IV SOLN
INTRAVENOUS | Status: DC | PRN
  Administered 2015-02-24: 50 mg via INTRAVENOUS

## 2015-02-24 MED ORDER — KETOROLAC TROMETHAMINE 30 MG/ML IJ SOLN: 30.0000 mg | Freq: Four times a day (QID) | INTRAMUSCULAR | Status: DC

## 2015-02-24 MED ORDER — ONDANSETRON HCL 4 MG/2ML IJ SOLN
INTRAMUSCULAR | Status: AC
  Filled 2015-02-24: qty 2

## 2015-02-24 MED ORDER — ONDANSETRON HCL 4 MG/2ML IJ SOLN
INTRAMUSCULAR | Status: DC | PRN
  Administered 2015-02-24: 4 mg via INTRAVENOUS

## 2015-02-24 MED ORDER — SCOPOLAMINE 1 MG/3DAYS TD PT72
1.0000 | MEDICATED_PATCH | Freq: Once | TRANSDERMAL | Status: DC
  Administered 2015-02-24: 1.5 mg via TRANSDERMAL

## 2015-02-24 MED ORDER — ARTIFICIAL TEARS OP OINT
TOPICAL_OINTMENT | OPHTHALMIC | Status: AC
  Filled 2015-02-24: qty 3.5

## 2015-02-24 MED ORDER — BUPIVACAINE LIPOSOME 1.3 % IJ SUSP
20.0000 mL | Freq: Once | INTRAMUSCULAR | Status: AC
  Administered 2015-02-24: 20 mL
  Filled 2015-02-24: qty 20

## 2015-02-24 MED ORDER — SODIUM CHLORIDE 0.9 % IJ SOLN
INTRAMUSCULAR | Status: AC
  Filled 2015-02-24: qty 50

## 2015-02-24 MED ORDER — LACTATED RINGERS IV SOLN
INTRAVENOUS | Status: DC
  Administered 2015-02-24: 13:00:00 via INTRAVENOUS

## 2015-02-24 MED ORDER — DEXAMETHASONE SODIUM PHOSPHATE 10 MG/ML IJ SOLN
INTRAMUSCULAR | Status: DC | PRN
  Administered 2015-02-24: 4 mg via INTRAVENOUS

## 2015-02-24 MED ORDER — KETOROLAC TROMETHAMINE 30 MG/ML IJ SOLN
INTRAMUSCULAR | Status: AC
  Filled 2015-02-24: qty 1

## 2015-02-24 MED ORDER — METOCLOPRAMIDE HCL 5 MG/ML IJ SOLN
10.0000 mg | Freq: Once | INTRAMUSCULAR | Status: AC | PRN
  Administered 2015-02-24: 10 mg via INTRAVENOUS

## 2015-02-24 MED ORDER — HYDROMORPHONE HCL 1 MG/ML IJ SOLN
0.2500 mg | INTRAMUSCULAR | Status: DC | PRN
  Administered 2015-02-24 (×4): 0.5 mg via INTRAVENOUS

## 2015-02-24 MED ORDER — LACTATED RINGERS IR SOLN
Status: DC | PRN
  Administered 2015-02-24: 3000 mL

## 2015-02-24 MED ORDER — NEOSTIGMINE METHYLSULFATE 10 MG/10ML IV SOLN
INTRAVENOUS | Status: AC
  Filled 2015-02-24: qty 1

## 2015-02-24 MED ORDER — MENTHOL 3 MG MT LOZG: 1.0000 | LOZENGE | OROMUCOSAL | Status: DC | PRN

## 2015-02-24 MED ORDER — PANTOPRAZOLE SODIUM 40 MG PO TBEC
40.0000 mg | DELAYED_RELEASE_TABLET | Freq: Every day | ORAL | Status: DC
  Administered 2015-02-25: 40 mg via ORAL
  Filled 2015-02-24: qty 1

## 2015-02-24 MED ORDER — LIDOCAINE HCL (CARDIAC) 20 MG/ML IV SOLN
INTRAVENOUS | Status: AC
  Filled 2015-02-24: qty 5

## 2015-02-24 MED ORDER — SCOPOLAMINE 1 MG/3DAYS TD PT72
MEDICATED_PATCH | TRANSDERMAL | Status: AC
  Administered 2015-02-24: 1.5 mg via TRANSDERMAL
  Filled 2015-02-24: qty 1

## 2015-02-24 MED ORDER — LORATADINE 10 MG PO TABS
10.0000 mg | ORAL_TABLET | Freq: Every day | ORAL | Status: DC
  Administered 2015-02-25: 10 mg via ORAL
  Filled 2015-02-24 (×2): qty 1

## 2015-02-24 MED ORDER — HYDROMORPHONE HCL 1 MG/ML IJ SOLN
0.2500 mg | INTRAMUSCULAR | Status: DC | PRN
  Administered 2015-02-24 (×2): 0.5 mg via INTRAVENOUS

## 2015-02-24 MED ORDER — MIDAZOLAM HCL 2 MG/2ML IJ SOLN
INTRAMUSCULAR | Status: AC
  Filled 2015-02-24: qty 2

## 2015-02-24 MED ORDER — LIDOCAINE HCL (CARDIAC) 20 MG/ML IV SOLN
INTRAVENOUS | Status: DC | PRN
  Administered 2015-02-24: 50 mg via INTRAVENOUS

## 2015-02-24 SURGICAL SUPPLY — 75 items
BARRIER ADHS 3X4 INTERCEED (GAUZE/BANDAGES/DRESSINGS) ×3 IMPLANT
BENZOIN TINCTURE PRP APPL 2/3 (GAUZE/BANDAGES/DRESSINGS) ×3 IMPLANT
CHLORAPREP W/TINT 26ML (MISCELLANEOUS) ×3 IMPLANT
CLOTH BEACON ORANGE TIMEOUT ST (SAFETY) ×3 IMPLANT
CONT PATH 16OZ SNAP LID 3702 (MISCELLANEOUS) ×3 IMPLANT
COVER BACK TABLE 60X90IN (DRAPES) ×6 IMPLANT
COVER TIP SHEARS 8 DVNC (MISCELLANEOUS) ×2 IMPLANT
COVER TIP SHEARS 8MM DA VINCI (MISCELLANEOUS) ×1
DECANTER SPIKE VIAL GLASS SM (MISCELLANEOUS) ×3 IMPLANT
DRAPE WARM FLUID 44X44 (DRAPE) ×3 IMPLANT
DRSG COVADERM PLUS 2X2 (GAUZE/BANDAGES/DRESSINGS) IMPLANT
DRSG OPSITE POSTOP 3X4 (GAUZE/BANDAGES/DRESSINGS) ×3 IMPLANT
DURAPREP 26ML APPLICATOR (WOUND CARE) ×3 IMPLANT
ELECT REM PT RETURN 9FT ADLT (ELECTROSURGICAL) ×3
ELECTRODE REM PT RTRN 9FT ADLT (ELECTROSURGICAL) ×2 IMPLANT
GAUZE VASELINE 3X9 (GAUZE/BANDAGES/DRESSINGS) IMPLANT
GLOVE BIO SURGEON STRL SZ 6.5 (GLOVE) ×3 IMPLANT
GLOVE BIO SURGEON STRL SZ7 (GLOVE) ×6 IMPLANT
GLOVE BIOGEL PI IND STRL 6.5 (GLOVE) ×2 IMPLANT
GLOVE BIOGEL PI IND STRL 7.0 (GLOVE) ×4 IMPLANT
GLOVE BIOGEL PI INDICATOR 6.5 (GLOVE) ×1
GLOVE BIOGEL PI INDICATOR 7.0 (GLOVE) ×2
GLOVE ECLIPSE 6.5 STRL STRAW (GLOVE) ×9 IMPLANT
GRASPER BIPOLAR FEN DA VINCI (INSTRUMENTS)
GRASPER BPLR FEN DVNC (INSTRUMENTS) IMPLANT
GYRUS RUMI II 2.5CM BLUE (DISPOSABLE)
GYRUS RUMI II 3.5CM BLUE (DISPOSABLE) ×3
GYRUS RUMI II 4.0CM BLUE (DISPOSABLE)
KIT ACCESSORY DA VINCI DISP (KITS) ×1
KIT ACCESSORY DVNC DISP (KITS) ×2 IMPLANT
LEGGING LITHOTOMY PAIR STRL (DRAPES) ×3 IMPLANT
LIQUID BAND (GAUZE/BANDAGES/DRESSINGS) ×3 IMPLANT
MANIPULATOR UTERINE 4.5 ZUMI (MISCELLANEOUS) IMPLANT
NEEDLE INSUFFLATION 120MM (ENDOMECHANICALS) ×3 IMPLANT
NS IRRIG 1000ML POUR BTL (IV SOLUTION) ×9 IMPLANT
OCCLUDER COLPOPNEUMO (BALLOONS) ×6 IMPLANT
PACK ROBOT WH (CUSTOM PROCEDURE TRAY) ×3 IMPLANT
PACK ROBOTIC GOWN (GOWN DISPOSABLE) ×3 IMPLANT
PAD POSITIONER PINK NONSTERILE (MISCELLANEOUS) ×3 IMPLANT
PAD PREP 24X48 CUFFED NSTRL (MISCELLANEOUS) ×6 IMPLANT
RUMI II 3.0CM BLUE KOH-EFFICIE (DISPOSABLE) IMPLANT
RUMI II GYRUS 2.5CM BLUE (DISPOSABLE) IMPLANT
RUMI II GYRUS 3.5CM BLUE (DISPOSABLE) ×2 IMPLANT
RUMI II GYRUS 4.0CM BLUE (DISPOSABLE) IMPLANT
SET CYSTO W/LG BORE CLAMP LF (SET/KITS/TRAYS/PACK) IMPLANT
SET IRRIG TUBING LAPAROSCOPIC (IRRIGATION / IRRIGATOR) ×3 IMPLANT
SET TRI-LUMEN FLTR TB AIRSEAL (TUBING) ×3 IMPLANT
STRIP CLOSURE SKIN 1/2X4 (GAUZE/BANDAGES/DRESSINGS) ×3 IMPLANT
SUT DVC VLOC 180 0 12IN GS21 (SUTURE)
SUT VIC AB 0 CT1 27 (SUTURE) ×5
SUT VIC AB 0 CT1 27XBRD ANBCTR (SUTURE) ×10 IMPLANT
SUT VIC AB 2-0 CT2 27 (SUTURE) ×6 IMPLANT
SUT VIC AB 2-0 UR6 27 (SUTURE) ×3 IMPLANT
SUT VICRYL 0 UR6 27IN ABS (SUTURE) ×6 IMPLANT
SUT VICRYL RAPIDE 3 0 (SUTURE) ×6 IMPLANT
SUT VLOC 180 0 9IN  GS21 (SUTURE) ×1
SUT VLOC 180 0 9IN GS21 (SUTURE) ×2 IMPLANT
SUTURE DVC VLC 180 0 12IN GS21 (SUTURE) IMPLANT
SYR 50ML LL SCALE MARK (SYRINGE) ×3 IMPLANT
SYSTEM CONVERTIBLE TROCAR (TROCAR) IMPLANT
TIP RUMI ORANGE 6.7MMX12CM (TIP) IMPLANT
TIP UTERINE 5.1X6CM LAV DISP (MISCELLANEOUS) IMPLANT
TIP UTERINE 6.7X10CM GRN DISP (MISCELLANEOUS) ×3 IMPLANT
TIP UTERINE 6.7X6CM WHT DISP (MISCELLANEOUS) IMPLANT
TIP UTERINE 6.7X8CM BLUE DISP (MISCELLANEOUS) IMPLANT
TOWEL OR 17X24 6PK STRL BLUE (TOWEL DISPOSABLE) ×6 IMPLANT
TRAY FOLEY CATH SILVER 14FR (SET/KITS/TRAYS/PACK) ×3 IMPLANT
TROCAR DILATING TIP 12MM 150MM (ENDOMECHANICALS) ×3 IMPLANT
TROCAR DISP BLADELESS 8 DVNC (TROCAR) ×2 IMPLANT
TROCAR DISP BLADELESS 8MM (TROCAR) ×1
TROCAR OPTI TIP 12M 100M (ENDOMECHANICALS) IMPLANT
TROCAR PORT AIRSEAL 5X120 (TROCAR) ×3 IMPLANT
TROCAR XCEL 12X100 BLDLESS (ENDOMECHANICALS) ×3 IMPLANT
TROCAR XCEL NON-BLD 5MMX100MML (ENDOMECHANICALS) ×3 IMPLANT
WATER STERILE IRR 1000ML POUR (IV SOLUTION) ×9 IMPLANT

## 2015-02-24 NOTE — Brief Op Note (Signed)
02/24/2015  9:12 AM  PATIENT:  Kristen Chan  44 y.o. female  PRE-OPERATIVE DIAGNOSIS:  FIBROIDS  POST-OPERATIVE DIAGNOSIS:  Fibroids  PROCEDURE:  Procedure(s): ROBOTIC ASSISTED BILATERAL SALPINGO OOPHORECTOMY (N/A) CYSTOSCOPY (N/A)  SURGEON:  Surgeon(s) and Role:    * Bobbye Charleston, MD - Primary    * Allyn Kenner, DO - Assisting  ANESTHESIA:   general  EBL:  Total I/O In: 1000 [I.V.:1000] Out: 600 [Urine:500; Blood:100]  LOCAL MEDICATIONS USED:  OTHER Exparel  SPECIMEN:  Source of Specimen:  uterus, cervix, bilateral ovaries and tubes  DISPOSITION OF SPECIMEN:  PATHOLOGY  COUNTS:  YES  TOURNIQUET:  * No tourniquets in log *  DICTATION: .Note written in EPIC  PLAN OF CARE: Admit for overnight observation  PATIENT DISPOSITION:  PACU - hemodynamically stable.   Delay start of Pharmacological VTE agent (>24hrs) due to surgical blood loss or risk of bleeding: not applicable  Complications:  None.  Findings: 12 weeks size uterus with multiple fibroids.  Ovaries were normal bilaterally but adhesed.  No evidence of endometriosis.  The ureters were identified during multiple points of the case and were always out of the field of dissection.  On cystoscopy, the bladder was intact and bilateral spill was seen from each ureteral oriface.    Medications: Exparel  intra peritoneal.  Technique:  After adequate anesthesia was achieved the patient was positioned, prepped and draped in usual sterile fashion.  A speculum was placed in the vagina and the cervix dilated with pratt dilators.  The 10 cm Rumi and 3.5 cm Koh ring were assembled and placed in proper fashion.  The  Speculum was removed and the bladder catheterized with a foley.    Attention was turned to the abdomen where a 1 cm incision was made above the umbilicus.  The veress needle was inserted without aspiration of bowel contents or blood.  The long 12 mm trocar was placed and the other three trocar sites were  marked out, all approximately 10 cm from each other and the camera. Two 8.5 mm trocars were placed on either side of the camera port.  A 5 mm airseal assistant port was placed 32 cm above the level of the plane of the other trocars on the L.  All trocars were inserted under direct visualization of the camera.  The patient was placed in trendelenburg and then the Robot docked.  The PK forceps were placed on arm 2 and the Hot shears on arm 1 and introduced under direct visualization of the camera.   I then broke scrub and sat down at the console.  The above findings were noted and the ureters identified well out of the field of dissection.  The right round ligament was identified and cauterized with the PK.  It  was then divided with the PK cautery and shears.  The peritoneum parallel to the IP ligament was carefully incised and the posterior broad ligament was then incised under the right IP ligament with care to be above the ureter.  The ureters were dissected out to the level of the uterine arteries bilaterally and the uterine arteries were cauterized above the ureter. The R IP ligament was cauterized with the PK and then incised with the shears.  The Broad and cardinal ligaments were then cauterized against the cervix to the level of the Koh ring, securing the uterine artery.  Each pedicle was then incised with the shears.  The anterior leaf was then incised at the reflection of  the vessico-uterine junction and the lateral bladder retracted inferiorly after the round ligament had been divided with the PK forceps.  The round ligament was cauterized with the PK and divided with the shears;  then the left IP ligament divided with the PK forceps and the scissors in the same manner as the right. The broad and cardinal ligaments were then cauterized on the left in the same way.   At the level of the internal os, the uterine arteries were bilaterally cauterized with the PK.  The ureters were identified well out of the  field of dissection.   The anterior peritoneum was tented up and incised with the monopolar and the bladder retracted inferiorly.  The vesicovaginal fascia was incised with the monopolar shears and then pushed inferiorly off the vagina to about one cm below the Koh ring.   The uterus was amputated at the level of the reflection of the vagina onto the cervix with monopolar cautery and then removed through the vagina.  The pedicles were checked with the gas pressure down and found to be hemostatic.  A small incision was made incidentally diagonally from the cuff edge inferiorly and this was closed with a 2-o vicryl suture in a figure of eight. The vaginal cuff was closed with a running stitch of 0 V-Loc.  The Robot was undocked and Exparel placed inside the peritoneal cavity at the cuff.  All instruments and trocars were withdrawn from the abdomen, the abdomen desufflated and the 12 mm fascial incision closed with a figure of eight stitch of 2-0 vicryl.  The skin incisions were closed with 4-0 vicryl R and dermabond.  The cystoscope was placed in the bladder and good spill was seen from the bilateral ureteral orifices.  The bladder was intact.    The patient tolerated the procedure well and was returned to the recovery room in stable condition.

## 2015-02-24 NOTE — Anesthesia Preprocedure Evaluation (Signed)
Anesthesia Evaluation  Patient identified by MRN, date of birth, ID band Patient awake    Reviewed: Allergy & Precautions, NPO status , Patient's Chart, lab work & pertinent test results  History of Anesthesia Complications (+) history of anesthetic complications  Airway Mallampati: II  TM Distance: >3 FB Neck ROM: Full    Dental no notable dental hx. (+) Teeth Intact   Pulmonary neg pulmonary ROS,    Pulmonary exam normal       Cardiovascular negative cardio ROS Normal cardiovascular examRhythm:Regular Rate:Normal     Neuro/Psych negative neurological ROS  negative psych ROS   GI/Hepatic Neg liver ROS, GERD-  Medicated and Controlled,  Endo/Other  Hypothyroidism   Renal/GU negative Renal ROS  negative genitourinary   Musculoskeletal negative musculoskeletal ROS (+)   Abdominal   Peds  Hematology  (+) anemia ,   Anesthesia Other Findings   Reproductive/Obstetrics Fibroid uterus                             Anesthesia Physical Anesthesia Plan  ASA: II  Anesthesia Plan: General   Post-op Pain Management:    Induction: Intravenous  Airway Management Planned: Oral ETT  Additional Equipment:   Intra-op Plan:   Post-operative Plan: Extubation in OR  Informed Consent: I have reviewed the patients History and Physical, chart, labs and discussed the procedure including the risks, benefits and alternatives for the proposed anesthesia with the patient or authorized representative who has indicated his/her understanding and acceptance.   Dental advisory given  Plan Discussed with: CRNA, Anesthesiologist and Surgeon  Anesthesia Plan Comments:         Anesthesia Quick Evaluation

## 2015-02-24 NOTE — Anesthesia Procedure Notes (Signed)
Procedure Name: Intubation Date/Time: 02/24/2015 7:28 AM Performed by: Bobbye Charleston Pre-anesthesia Checklist: Patient identified, Emergency Drugs available, Suction available, Patient being monitored and Timeout performed Patient Re-evaluated:Patient Re-evaluated prior to inductionOxygen Delivery Method: Circle system utilized Preoxygenation: Pre-oxygenation with 100% oxygen Intubation Type: IV induction Ventilation: Mask ventilation without difficulty Laryngoscope Size: Miller and 2 Grade View: Grade I Tube type: Oral Number of attempts: 1 Airway Equipment and Method: Stylet Placement Confirmation: ETT inserted through vocal cords under direct vision,  positive ETCO2 and breath sounds checked- equal and bilateral Secured at: 21 cm Tube secured with: Tape Dental Injury: Teeth and Oropharynx as per pre-operative assessment

## 2015-02-24 NOTE — Progress Notes (Signed)
There has been no change in the patients history, status or exam since the history and physical.  She definitely desires BSO- pain on right persists and wants to do everything to correct that.  D/w pt that it may or may not help but she does not desire to retain ovaries.   Filed Vitals:   02/24/15 0617  BP: 118/92  Pulse: 84  Temp: 98.8 F (37.1 C)  TempSrc: Oral  Resp: 18  SpO2: 100%    Recent Results (from the past 2160 hour(s))  CBC     Status: None   Collection Time: 02/22/15  8:55 AM  Result Value Ref Range   WBC 7.5 4.0 - 10.5 K/uL   RBC 4.27 3.87 - 5.11 MIL/uL   Hemoglobin 13.8 12.0 - 15.0 g/dL   HCT 40.9 36.0 - 46.0 %   MCV 95.8 78.0 - 100.0 fL   MCH 32.3 26.0 - 34.0 pg   MCHC 33.7 30.0 - 36.0 g/dL   RDW 13.2 11.5 - 15.5 %   Platelets 334 150 - 400 K/uL  Pregnancy, urine     Status: None   Collection Time: 02/24/15  6:15 AM  Result Value Ref Range   Preg Test, Ur NEGATIVE NEGATIVE    Comment:        THE SENSITIVITY OF THIS METHODOLOGY IS >20 mIU/mL.     Elissa Grieshop A

## 2015-02-24 NOTE — Transfer of Care (Signed)
Immediate Anesthesia Transfer of Care Note  Patient: Kristen Chan  Procedure(s) Performed: Procedure(s): ROBOTIC ASSISTED BILATERAL SALPINGO OOPHORECTOMY (N/A) CYSTOSCOPY (N/A)  Patient Location: PACU  Anesthesia Type:General  Level of Consciousness: awake, alert  and oriented  Airway & Oxygen Therapy: Patient Spontanous Breathing and Patient connected to nasal cannula oxygen  Post-op Assessment: Report given to RN and Post -op Vital signs reviewed and stable  Post vital signs: Reviewed and stable  Last Vitals:  Filed Vitals:   02/24/15 0617  BP: 118/92  Pulse: 84  Temp: 37.1 C  Resp: 18    Complications: No apparent anesthesia complications

## 2015-02-24 NOTE — Anesthesia Postprocedure Evaluation (Signed)
  Anesthesia Post-op Note  Patient: Kristen Chan  Procedure(s) Performed: Procedure(s): ROBOTIC ASSISTED BILATERAL SALPINGO OOPHORECTOMY (N/A) CYSTOSCOPY (N/A)  Patient Location: PACU  Anesthesia Type:General  Level of Consciousness: awake, alert  and oriented  Airway and Oxygen Therapy: Patient Spontanous Breathing and Patient connected to nasal cannula oxygen  Post-op Pain: mild  Post-op Assessment: Post-op Vital signs reviewed, Patient's Cardiovascular Status Stable, Respiratory Function Stable, Patent Airway, No signs of Nausea or vomiting and Pain level controlled  Post-op Vital Signs: Reviewed and stable  Last Vitals:  Filed Vitals:   02/24/15 1030  BP: 136/82  Pulse: 83  Temp:   Resp: 17    Complications: No apparent anesthesia complications

## 2015-02-24 NOTE — Anesthesia Postprocedure Evaluation (Signed)
  Anesthesia Post-op Note  Patient: Kristen Chan  Procedure(s) Performed: Procedure(s): ROBOTIC ASSISTED BILATERAL SALPINGO OOPHORECTOMY (N/A) CYSTOSCOPY (N/A)  Patient Location: PACU and [facility] Unit  Anesthesia Type:General  Level of Consciousness: awake, alert  and oriented  Airway and Oxygen Therapy: Patient Spontanous Breathing  Post-op Pain: none  Post-op Assessment: Post-op Vital signs reviewed and Patient's Cardiovascular Status Stable  Post-op Vital Signs: Reviewed and stable  Last Vitals:  Filed Vitals:   02/24/15 1615  BP: 111/77  Pulse: 78  Temp: 36.6 C  Resp: 18    Complications: No apparent anesthesia complications

## 2015-02-24 NOTE — Op Note (Signed)
02/24/2015  9:12 AM  PATIENT:  Kristen Chan  44 y.o. female  PRE-OPERATIVE DIAGNOSIS:  FIBROIDS  POST-OPERATIVE DIAGNOSIS:  Fibroids  PROCEDURE:  Procedure(s): ROBOTIC ASSISTED BILATERAL SALPINGO OOPHORECTOMY (N/A) CYSTOSCOPY (N/A)  SURGEON:  Surgeon(s) and Role:    * Bobbye Charleston, MD - Primary    * Allyn Kenner, DO - Assisting  ANESTHESIA:   general  EBL:  Total I/O In: 1000 [I.V.:1000] Out: 600 [Urine:500; Blood:100]  LOCAL MEDICATIONS USED:  OTHER Exparel  SPECIMEN:  Source of Specimen:  uterus, cervix, bilateral ovaries and tubes  DISPOSITION OF SPECIMEN:  PATHOLOGY  COUNTS:  YES  TOURNIQUET:  * No tourniquets in log *  DICTATION: .Note written in EPIC  PLAN OF CARE: Admit for overnight observation  PATIENT DISPOSITION:  PACU - hemodynamically stable.   Delay start of Pharmacological VTE agent (>24hrs) due to surgical blood loss or risk of bleeding: not applicable  Complications:  None.  Findings: 12 weeks size uterus with multiple fibroids.  Ovaries were normal bilaterally but adhesed.  No evidence of endometriosis.  The ureters were identified during multiple points of the case and were always out of the field of dissection.  On cystoscopy, the bladder was intact and bilateral spill was seen from each ureteral oriface.    Medications: Exparel  intra peritoneal.  Technique:  After adequate anesthesia was achieved the patient was positioned, prepped and draped in usual sterile fashion.  A speculum was placed in the vagina and the cervix dilated with pratt dilators.  The 10 cm Rumi and 3.5 cm Koh ring were assembled and placed in proper fashion.  The  Speculum was removed and the bladder catheterized with a foley.    Attention was turned to the abdomen where a 1 cm incision was made above the umbilicus.  The veress needle was inserted without aspiration of bowel contents or blood.  The long 12 mm trocar was placed and the other three trocar sites were  marked out, all approximately 10 cm from each other and the camera. Two 8.5 mm trocars were placed on either side of the camera port.  A 5 mm airseal assistant port was placed 32 cm above the level of the plane of the other trocars on the L.  All trocars were inserted under direct visualization of the camera.  The patient was placed in trendelenburg and then the Robot docked.  The PK forceps were placed on arm 2 and the Hot shears on arm 1 and introduced under direct visualization of the camera.   I then broke scrub and sat down at the console.  The above findings were noted and the ureters identified well out of the field of dissection.  The right round ligament was identified and cauterized with the PK.  It  was then divided with the PK cautery and shears.  The peritoneum parallel to the IP ligament was carefully incised and the posterior broad ligament was then incised under the right IP ligament with care to be above the ureter.  The ureters were dissected out to the level of the uterine arteries bilaterally and the uterine arteries were cauterized above the ureter. The R IP ligament was cauterized with the PK and then incised with the shears.  The Broad and cardinal ligaments were then cauterized against the cervix to the level of the Koh ring, securing the uterine artery.  Each pedicle was then incised with the shears.  The anterior leaf was then incised at the reflection of  the vessico-uterine junction and the lateral bladder retracted inferiorly after the round ligament had been divided with the PK forceps.  The round ligament was cauterized with the PK and divided with the shears;  then the left IP ligament divided with the PK forceps and the scissors in the same manner as the right. The broad and cardinal ligaments were then cauterized on the left in the same way.   At the level of the internal os, the uterine arteries were bilaterally cauterized with the PK.  The ureters were identified well out of the  field of dissection.   The anterior peritoneum was tented up and incised with the monopolar and the bladder retracted inferiorly.  The vesicovaginal fascia was incised with the monopolar shears and then pushed inferiorly off the vagina to about one cm below the Koh ring.   The uterus was amputated at the level of the reflection of the vagina onto the cervix with monopolar cautery and then removed through the vagina.  The pedicles were checked with the gas pressure down and found to be hemostatic.  A small incision was made incidentally diagonally from the cuff edge inferiorly and this was closed with a 2-o vicryl suture in a figure of eight. The vaginal cuff was closed with a running stitch of 0 V-Loc.  The Robot was undocked and Exparel placed inside the peritoneal cavity at the cuff.  All instruments and trocars were withdrawn from the abdomen, the abdomen desufflated and the 12 mm fascial incision closed with a figure of eight stitch of 2-0 vicryl.  The skin incisions were closed with 4-0 vicryl R and dermabond.  The cystoscope was placed in the bladder and good spill was seen from the bilateral ureteral orifices.  The bladder was intact.    The patient tolerated the procedure well and was returned to the recovery room in stable condition.

## 2015-02-25 ENCOUNTER — Encounter (HOSPITAL_COMMUNITY): Payer: Self-pay | Admitting: Obstetrics and Gynecology

## 2015-02-25 DIAGNOSIS — D259 Leiomyoma of uterus, unspecified: Secondary | ICD-10-CM | POA: Diagnosis not present

## 2015-02-25 LAB — COMPREHENSIVE METABOLIC PANEL
ALT: 13 U/L — ABNORMAL LOW (ref 14–54)
AST: 19 U/L (ref 15–41)
Albumin: 3.1 g/dL — ABNORMAL LOW (ref 3.5–5.0)
Alkaline Phosphatase: 41 U/L (ref 38–126)
Anion gap: 6 (ref 5–15)
BUN: 5 mg/dL — ABNORMAL LOW (ref 6–20)
CHLORIDE: 101 mmol/L (ref 101–111)
CO2: 28 mmol/L (ref 22–32)
Calcium: 8.2 mg/dL — ABNORMAL LOW (ref 8.9–10.3)
Creatinine, Ser: 0.67 mg/dL (ref 0.44–1.00)
GFR calc Af Amer: >60 mL/min (ref >60)
GLUCOSE: 99 mg/dL (ref 65–99)
POTASSIUM: 3.8 mmol/L (ref 3.5–5.1)
Sodium: 135 mmol/L (ref 135–145)
TOTAL PROTEIN: 6.3 g/dL — AB (ref 6.5–8.1)
Total Bilirubin: 0.9 mg/dL (ref 0.3–1.2)

## 2015-02-25 MED ORDER — TRAMADOL HCL 50 MG PO TABS: 50.0000 mg | ORAL_TABLET | Freq: Four times a day (QID) | ORAL | Status: AC | PRN

## 2015-02-25 NOTE — Progress Notes (Signed)
Patient is eating, ambulating, and voiding.  Pain control is good.  BP 111/76 mmHg  Pulse 72  Temp(Src) 99.2 F (37.3 C) (Oral)  Resp 16  Ht 5\' 5"  (1.651 m)  Wt 81.647 kg (180 lb)  BMI 29.95 kg/m2  SpO2 97%  lungs:   clear to auscultation cor:    RRR Abdomen:  soft, appropriate tenderness, incisions intact and without erythema or exudate. ex:    no cords   Results for orders placed or performed during the hospital encounter of 02/24/15 (from the past 24 hour(s))  Comprehensive metabolic panel     Status: Abnormal   Collection Time: 02/25/15  5:19 AM  Result Value Ref Range   Sodium 135 135 - 145 mmol/L   Potassium 3.8 3.5 - 5.1 mmol/L   Chloride 101 101 - 111 mmol/L   CO2 28 22 - 32 mmol/L   Glucose, Bld 99 65 - 99 mg/dL   BUN 5 (L) 6 - 20 mg/dL   Creatinine, Ser 0.67 0.44 - 1.00 mg/dL   Calcium 8.2 (L) 8.9 - 10.3 mg/dL   Total Protein 6.3 (L) 6.5 - 8.1 g/dL   Albumin 3.1 (L) 3.5 - 5.0 g/dL   AST 19 15 - 41 U/L   ALT 13 (L) 14 - 54 U/L   Alkaline Phosphatase 41 38 - 126 U/L   Total Bilirubin 0.9 0.3 - 1.2 mg/dL   GFR calc non Af Amer >60 >60 mL/min   GFR calc Af Amer >60 >60 mL/min   Anion gap 6 5 - 15    A/P  Routine care.  Expect d/c per plan.

## 2015-02-25 NOTE — Discharge Summary (Signed)
Physician Discharge Summary  Patient ID: Kristen Chan MRN: [id number] DOB/AGE: 1970/11/29 44 y.o.  Admit date: 02/24/2015 Discharge date: 02/25/2015  Admission Diagnoses:symptomatic fibroid uterus and RLQ pain  Discharge Diagnoses: same Active Problems:   Postoperative state   Discharged Condition: good  Hospital Course: Uncomplicated Robo TLH/BSO/cysto  Consults: None  Significant Diagnostic Studies: labs:  Results for orders placed or performed during the hospital encounter of 02/24/15 (from the past 24 hour(s))  Comprehensive metabolic panel     Status: Abnormal   Collection Time: 02/25/15  5:19 AM  Result Value Ref Range   Sodium 135 135 - 145 mmol/L   Potassium 3.8 3.5 - 5.1 mmol/L   Chloride 101 101 - 111 mmol/L   CO2 28 22 - 32 mmol/L   Glucose, Bld 99 65 - 99 mg/dL   BUN 5 (L) 6 - 20 mg/dL   Creatinine, Ser 0.67 0.44 - 1.00 mg/dL   Calcium 8.2 (L) 8.9 - 10.3 mg/dL   Total Protein 6.3 (L) 6.5 - 8.1 g/dL   Albumin 3.1 (L) 3.5 - 5.0 g/dL   AST 19 15 - 41 U/L   ALT 13 (L) 14 - 54 U/L   Alkaline Phosphatase 41 38 - 126 U/L   Total Bilirubin 0.9 0.3 - 1.2 mg/dL   GFR calc non Af Amer >60 >60 mL/min   GFR calc Af Amer >60 >60 mL/min   Anion gap 6 5 - 15    Treatments: surgery: Robotic TLH/BSO/cysto  Discharge Exam: Blood pressure 111/76, pulse 72, temperature 99.2 F (37.3 C), temperature source Oral, resp. rate 16, height 5\' 5"  (1.651 m), weight 81.647 kg (180 lb), SpO2 97 %.   Disposition:   Discharge Instructions    Call MD for:  temperature >100.4    Complete by:  As directed      Diet - low sodium heart healthy    Complete by:  As directed      Discharge instructions    Complete by:  As directed   No driving on narcotics, no sexual activity for 2 weeks.     Increase activity slowly    Complete by:  As directed      May shower / Bathe    Complete by:  As directed   Shower, no bath for 2 weeks.     Remove dressing in 24 hours    Complete by:  As  directed      Sexual Activity Restrictions    Complete by:  As directed   No sexual activity for 2 weeks.            Medication List    TAKE these medications        drospirenone-ethinyl estradiol 3-0.02 MG tablet  Commonly known as:  YAZ,GIANVI,LORYNA  Take 1 tablet by mouth daily.     fexofenadine 180 MG tablet  Commonly known as:  ALLEGRA  Take 180 mg by mouth daily.     ibuprofen 200 MG tablet  Commonly known as:  ADVIL,MOTRIN  Take 600 mg by mouth every 6 (six) hours as needed for headache or mild pain.     levothyroxine 50 MCG tablet  Commonly known as:  SYNTHROID, LEVOTHROID  Take 50 mcg by mouth daily before breakfast.     multivitamin with minerals Tabs tablet  Take 1 tablet by mouth daily.     omeprazole 20 MG capsule  Commonly known as:  PRILOSEC  Take 20 mg by mouth daily.  traMADol 50 MG tablet  Commonly known as:  ULTRAM  Take 1 tablet (50 mg total) by mouth every 6 (six) hours as needed for moderate pain.           Follow-up Information    Follow up with Khamiya Varin A, MD.   Specialty:  Obstetrics and Gynecology   Contact information:   [address] [place] [address] [phone]       Signed: Brayleigh Rybacki A 02/25/2015, 6:55 AM

## 2015-02-25 NOTE — Progress Notes (Signed)
Pt discharged to home with huband and daughter.  Condition stable.  Pt to car via wheelchair with Astrid Divine, NT.  No equipment for home ordered at discharge.

## 2015-09-09 ENCOUNTER — Other Ambulatory Visit: Payer: Self-pay | Admitting: Obstetrics and Gynecology

## 2015-09-10 LAB — CYTOLOGY - PAP

## 2016-04-24 DIAGNOSIS — Z0000 Encounter for general adult medical examination without abnormal findings: Secondary | ICD-10-CM | POA: Diagnosis not present

## 2016-04-24 DIAGNOSIS — E039 Hypothyroidism, unspecified: Secondary | ICD-10-CM | POA: Diagnosis not present

## 2016-04-24 DIAGNOSIS — E785 Hyperlipidemia, unspecified: Secondary | ICD-10-CM | POA: Diagnosis not present

## 2016-06-13 DIAGNOSIS — E039 Hypothyroidism, unspecified: Secondary | ICD-10-CM | POA: Diagnosis not present

## 2016-06-15 DIAGNOSIS — H5213 Myopia, bilateral: Secondary | ICD-10-CM | POA: Diagnosis not present

## 2016-09-13 ENCOUNTER — Other Ambulatory Visit: Payer: Self-pay | Admitting: Obstetrics and Gynecology

## 2016-09-13 DIAGNOSIS — Z01419 Encounter for gynecological examination (general) (routine) without abnormal findings: Secondary | ICD-10-CM | POA: Diagnosis not present

## 2016-09-13 DIAGNOSIS — Z1231 Encounter for screening mammogram for malignant neoplasm of breast: Secondary | ICD-10-CM | POA: Diagnosis not present

## 2016-09-13 DIAGNOSIS — Z6822 Body mass index (BMI) 22.0-22.9, adult: Secondary | ICD-10-CM | POA: Diagnosis not present

## 2016-09-13 DIAGNOSIS — Z1272 Encounter for screening for malignant neoplasm of vagina: Secondary | ICD-10-CM | POA: Diagnosis not present

## 2016-09-13 DIAGNOSIS — Z124 Encounter for screening for malignant neoplasm of cervix: Secondary | ICD-10-CM | POA: Diagnosis not present

## 2016-09-14 LAB — CYTOLOGY - PAP

## 2016-10-21 DIAGNOSIS — M26629 Arthralgia of temporomandibular joint, unspecified side: Secondary | ICD-10-CM | POA: Diagnosis not present

## 2016-10-21 DIAGNOSIS — J329 Chronic sinusitis, unspecified: Secondary | ICD-10-CM | POA: Diagnosis not present

## 2017-02-06 DIAGNOSIS — J0100 Acute maxillary sinusitis, unspecified: Secondary | ICD-10-CM | POA: Diagnosis not present

## 2017-03-23 DIAGNOSIS — J0141 Acute recurrent pansinusitis: Secondary | ICD-10-CM | POA: Diagnosis not present

## 2017-03-25 ENCOUNTER — Emergency Department (HOSPITAL_COMMUNITY)
Admission: EM | Admit: 2017-03-25 | Discharge: 2017-03-25 | Disposition: A | Discharge status: Home / Self Care | Payer: BLUE CROSS/BLUE SHIELD | Attending: Emergency Medicine | Admitting: Emergency Medicine

## 2017-03-25 ENCOUNTER — Encounter (HOSPITAL_COMMUNITY): Payer: Self-pay | Admitting: Emergency Medicine

## 2017-03-25 DIAGNOSIS — E039 Hypothyroidism, unspecified: Secondary | ICD-10-CM | POA: Diagnosis not present

## 2017-03-25 DIAGNOSIS — K120 Recurrent oral aphthae: Secondary | ICD-10-CM | POA: Diagnosis not present

## 2017-03-25 DIAGNOSIS — H9201 Otalgia, right ear: Secondary | ICD-10-CM | POA: Diagnosis present

## 2017-03-25 DIAGNOSIS — H6691 Otitis media, unspecified, right ear: Secondary | ICD-10-CM | POA: Insufficient documentation

## 2017-03-25 DIAGNOSIS — Z79899 Other long term (current) drug therapy: Secondary | ICD-10-CM | POA: Insufficient documentation

## 2017-03-25 DIAGNOSIS — H6690 Otitis media, unspecified, unspecified ear: Secondary | ICD-10-CM

## 2017-03-25 MED ORDER — OXYMETAZOLINE HCL 0.05 % NA SOLN: 1.0000 | Freq: Two times a day (BID) | NASAL | 0 refills | Status: AC

## 2017-03-25 MED ORDER — AMOXICILLIN-POT CLAVULANATE 875-125 MG PO TABS: 1.0000 | ORAL_TABLET | Freq: Two times a day (BID) | ORAL | 0 refills | Status: DC

## 2017-03-25 NOTE — Discharge Instructions (Signed)
Take your antibiotic as prescribed until completed. Stopped taking her prescription of doxycycline. I also recommend using the nasal spray as prescribed to help with your sinus congestion. You may also apply over-the-counter Orajel for pain relief of your oral lesions. I recommend continue take Tylenol and ibuprofen as prescribed over-the-counter, alternating between doses every 3-4 hours. Follow-up with your primary care provider in the next 2-3 days for follow-up evaluation. Please return to the Emergency Department if symptoms worsen or new onset of fever, headache, redness/swelling around air, tenderness behind the ear, ear drainage, loss of hearing, facial swelling, difficulty swallowing resulting in drooling, difficulty breathing, vomiting.

## 2017-03-25 NOTE — ED Triage Notes (Signed)
Patient went to urgent care for sinus, ear pain, and blister. Patient states the medication is not working and she is getting worse due the ear and the jaw are in more pain than it originally was. Patient is also getting more blisters. Patient is complaining of a headache also.

## 2017-03-25 NOTE — ED Notes (Signed)
Pt went to urgent care on Friday for otalgia, sinus pain, jaw pain, and blisters on her face, and she was given a steroid shot and doxycycline. Today, pt unable to sleep due to the pain and states more blisters have appeared.

## 2017-03-25 NOTE — ED Provider Notes (Signed)
[facility] DEPT Provider Note   CSN: [id number] Arrival date & time: 03/25/17  6203     History   Chief Complaint No chief complaint on file.   HPI Kristen Chan is a 46 y.o. female.  HPI   Patient is a 46 year old female with history of hypothyroidism who presents to the ED with multiple complaints. Patient states over the past 3 days she has had gradually worsening pain to her right ear that radiates into her right jaw. She also endorses having history of sinus congestion and states she is having increased congestion and pressure over the past week. She notes she was initially seen at an urgent care on Friday, was diagnosed with an ear infection, given steroid injection in the office and started on doxycycline. She states she has been taking an antibiotic without relief of symptoms. She also notes she had a filling fall out from her right lower molar yesterday morning and reports placing any temporary filling. She endorses having mild pain around her tooth. Patient also states she has had multiple small lesions to her lip and in her mouth that have been present for the past 5 days. Denies applying any ointments or taking any medications for her symptoms. Denies fever, chills, headache, neck stiffness, facial/neck swelling, sore throat, trismus, drooling, dysphagia, cough, shortness of breath, wheezing, chest pain, abdominal pain, vomiting, drainage. Denies use of any new soaps, lotions, detergents, toothpaste/mouthwash, makeup. Patient denies any other friends or family members having similar rash.  Past Medical History:  Diagnosis Date  . Anemia    history of anemia  . Complication of anesthesia    patient requests lightweight anesthesia  . GERD (gastroesophageal reflux disease)   . Hypothyroidism     Patient Active Problem List   Diagnosis Date Noted  . Postoperative state 02/24/2015    Past Surgical History:  Procedure Laterality Date  . APPENDECTOMY    .  CHOLECYSTECTOMY    . CYSTOSCOPY N/A 02/24/2015   Procedure: CYSTOSCOPY;  Surgeon: Bobbye Charleston, MD;  Location: [facility];  Service: Gynecology;  Laterality: N/A;  . KNEE ARTHROSCOPY    . MYOMECTOMY    . ROBOTIC ASSISTED BILATERAL SALPINGO OOPHERECTOMY N/A 02/24/2015   Procedure: ROBOTIC ASSISTED BILATERAL SALPINGO OOPHORECTOMY;  Surgeon: Bobbye Charleston, MD;  Location: [facility];  Service: Gynecology;  Laterality: N/A;  . thumb joint stabiliztion    . WISDOM TOOTH EXTRACTION      OB History    No data available       Home Medications    Prior to Admission medications   Medication Sig Start Date End Date Taking? Authorizing Provider  amoxicillin-clavulanate (AUGMENTIN) 875-125 MG tablet Take 1 tablet by mouth every 12 (twelve) hours. 03/25/17   Nona Dell, PA-C  drospirenone-ethinyl estradiol Sherrill Raring) 3-0.02 MG tablet Take 1 tablet by mouth daily.    [provider]  fexofenadine (ALLEGRA) 180 MG tablet Take 180 mg by mouth daily.    [provider]  ibuprofen (ADVIL,MOTRIN) 200 MG tablet Take 600 mg by mouth every 6 (six) hours as needed for headache or mild pain.    [provider]  levothyroxine (SYNTHROID, LEVOTHROID) 50 MCG tablet Take 50 mcg by mouth daily before breakfast.    [provider]  Multiple Vitamin (MULTIVITAMIN WITH MINERALS) TABS tablet Take 1 tablet by mouth daily.    [provider]  omeprazole (PRILOSEC) 20 MG capsule Take 20 mg by mouth daily.    [provider]  oxymetazoline Melissa Montane  NASAL SPRAY) 0.05 % nasal spray Place 1 spray into both nostrils 2 (two) times daily. Spray once into each nostril twice daily for up to the next 3 days. Do not use for more than 3 days to prevent rebound rhinorrhea. 03/25/17   Nona Dell, PA-C  traMADol (ULTRAM) 50 MG tablet Take 1 tablet (50 mg total) by mouth every 6 (six) hours as needed for moderate pain. 02/25/15   Bobbye Charleston, MD     Family History History reviewed. No pertinent family history.  Social History Social History  Substance Use Topics  . Smoking status: Never Smoker  . Smokeless tobacco: Never Used  . Alcohol use No     Allergies   Artichoke Adela Glimpse scolymus (artichoke)]; Mango flavor; Penicillins; and Sulfa antibiotics   Review of Systems Review of Systems  HENT: Positive for congestion, dental problem and ear pain.   Skin: Positive for rash.  All other systems reviewed and are negative.    Physical Exam Updated Vital Signs BP 126/90 (BP Location: Right Arm)   Pulse 88   Temp 98.2 F (36.8 C) (Oral)   Resp 16   Ht 5\' 6"  (1.676 m)   Wt 63.5 kg (140 lb)   LMP 04/22/2012   SpO2 97%   BMI 22.60 kg/m   Physical Exam  Constitutional: She is oriented to person, place, and time. She appears well-developed and well-nourished. No distress.  HENT:  Head: Normocephalic and atraumatic.  Right Ear: Hearing, external ear and ear canal normal. No mastoid tenderness. Tympanic membrane is erythematous and bulging. A middle ear effusion is present.  Left Ear: Hearing, tympanic membrane, external ear and ear canal normal. No mastoid tenderness.  Nose: Nose normal. Right sinus exhibits no maxillary sinus tenderness and no frontal sinus tenderness. Left sinus exhibits no maxillary sinus tenderness and no frontal sinus tenderness.  Mouth/Throat: Uvula is midline, oropharynx is clear and moist and mucous membranes are normal. Oral lesions present. No trismus in the jaw. Abnormal dentition. Dental caries present. No dental abscesses or uvula swelling. No oropharyngeal exudate, posterior oropharyngeal edema, posterior oropharyngeal erythema or tonsillar abscesses. No tonsillar exudate.    Multiple small round ulcers present to mucosal surface of lower lip with 2 small bulla filled with serous fluid present to outside of lower lid.   No trismus, drooling, facial/neck swelling or stridor on exam. No  muffled voice. Floor of mouth soft.  Pt tolerating secretions.   Eyes: Conjunctivae and EOM are normal. Right eye exhibits no discharge. Left eye exhibits no discharge. No scleral icterus.  Neck: Normal range of motion. Neck supple.  Cardiovascular: Normal rate, regular rhythm, normal heart sounds and intact distal pulses.   Pulmonary/Chest: Effort normal and breath sounds normal. No respiratory distress. She has no wheezes. She has no rales. She exhibits no tenderness.  Abdominal: Soft. She exhibits no distension and no mass. There is no tenderness. There is no rebound and no guarding.  Musculoskeletal: Normal range of motion. She exhibits no edema.  Lymphadenopathy:    She has no cervical adenopathy.  Neurological: She is alert and oriented to person, place, and time.  Skin: Skin is warm and dry. Rash noted. She is not diaphoretic.  No vesicles, pustules or drainage present. No lesions on palms or soles.  Nursing note and vitals reviewed.    ED Treatments / Results  Labs (all labs ordered are listed, but only abnormal results are displayed) Labs Reviewed - No data to display  EKG  EKG  Interpretation None       Radiology No results found.  Procedures Procedures (including critical care time)  Medications Ordered in ED Medications - No data to display   Initial Impression / Assessment and Plan / ED Course  I have reviewed the triage vital signs and the nursing notes.  Pertinent labs & imaging results that were available during my care of the patient were reviewed by me and considered in my medical decision making (see chart for details).     Patient presents with right ear pain for the past 3 days. VSS. Exam revealed erythematous bulging right TM with a middle ear effusion present. No mastoid tenderness. Presentation consistent with otitis media. No concern for acute mastoiditis, meningitis. Patient was initially prescribed doxycycline which she has been taking for the  past day and a half without relief. Pt reports low severity allergy to PCN (rash). Plan to discontinue rx of Doxy and start pt on Augmentin for ear infection and also given nasal spray for sinus congestion.     Patient also presents with rash/oral lesions that have been present for the past 5 days. Reports getting steroid injection when seen at urgent care on Friday but denies any other treatment or application of ointment. Denies fever. Exam revealed oral lesions consistent with aphthous ulcers. Plan to d/c pt home with symptomatic tx.   Advised pt to follow up with PCP in 2 days for follow up evaluation. Discussed strict return precautions.      Final Clinical Impressions(s) / ED Diagnoses   Final diagnoses:  Acute otitis media, unspecified otitis media type  Canker sores oral    New Prescriptions New Prescriptions   AMOXICILLIN-CLAVULANATE (AUGMENTIN) 875-125 MG TABLET    Take 1 tablet by mouth every 12 (twelve) hours.   OXYMETAZOLINE (AFRIN NASAL SPRAY) 0.05 % NASAL SPRAY    Place 1 spray into both nostrils 2 (two) times daily. Spray once into each nostril twice daily for up to the next 3 days. Do not use for more than 3 days to prevent rebound rhinorrhea.     Nona Dell, PA-C 03/25/17 4765    Varney Biles, MD 03/28/17 1800

## 2017-03-27 DIAGNOSIS — B0053 Herpesviral conjunctivitis: Secondary | ICD-10-CM | POA: Diagnosis not present

## 2017-03-27 DIAGNOSIS — R21 Rash and other nonspecific skin eruption: Secondary | ICD-10-CM | POA: Diagnosis not present

## 2017-03-27 DIAGNOSIS — H02411 Mechanical ptosis of right eyelid: Secondary | ICD-10-CM | POA: Diagnosis not present

## 2017-03-27 DIAGNOSIS — H578 Other specified disorders of eye and adnexa: Secondary | ICD-10-CM | POA: Diagnosis not present

## 2017-03-27 DIAGNOSIS — H9201 Otalgia, right ear: Secondary | ICD-10-CM | POA: Diagnosis not present

## 2017-03-27 DIAGNOSIS — B0239 Other herpes zoster eye disease: Secondary | ICD-10-CM | POA: Diagnosis not present

## 2017-03-27 DIAGNOSIS — H5711 Ocular pain, right eye: Secondary | ICD-10-CM | POA: Diagnosis not present

## 2017-03-29 DIAGNOSIS — B0221 Postherpetic geniculate ganglionitis: Secondary | ICD-10-CM | POA: Diagnosis not present

## 2017-04-05 DIAGNOSIS — B0221 Postherpetic geniculate ganglionitis: Secondary | ICD-10-CM | POA: Diagnosis not present

## 2017-04-19 DIAGNOSIS — H903 Sensorineural hearing loss, bilateral: Secondary | ICD-10-CM | POA: Diagnosis not present

## 2017-05-02 DIAGNOSIS — H9011 Conductive hearing loss, unilateral, right ear, with unrestricted hearing on the contralateral side: Secondary | ICD-10-CM | POA: Diagnosis not present

## 2017-05-02 DIAGNOSIS — H906 Mixed conductive and sensorineural hearing loss, bilateral: Secondary | ICD-10-CM | POA: Diagnosis not present

## 2017-05-03 DIAGNOSIS — E785 Hyperlipidemia, unspecified: Secondary | ICD-10-CM | POA: Diagnosis not present

## 2017-05-03 DIAGNOSIS — Z0000 Encounter for general adult medical examination without abnormal findings: Secondary | ICD-10-CM | POA: Diagnosis not present

## 2017-05-03 DIAGNOSIS — Z5181 Encounter for therapeutic drug level monitoring: Secondary | ICD-10-CM | POA: Diagnosis not present

## 2017-05-03 DIAGNOSIS — E039 Hypothyroidism, unspecified: Secondary | ICD-10-CM | POA: Diagnosis not present

## 2017-05-21 DIAGNOSIS — L237 Allergic contact dermatitis due to plants, except food: Secondary | ICD-10-CM | POA: Diagnosis not present

## 2017-05-30 DIAGNOSIS — B0053 Herpesviral conjunctivitis: Secondary | ICD-10-CM | POA: Diagnosis not present

## 2017-05-30 DIAGNOSIS — B0239 Other herpes zoster eye disease: Secondary | ICD-10-CM | POA: Diagnosis not present

## 2017-07-05 DIAGNOSIS — B0229 Other postherpetic nervous system involvement: Secondary | ICD-10-CM | POA: Diagnosis not present

## 2017-07-11 ENCOUNTER — Emergency Department (HOSPITAL_COMMUNITY): Payer: BLUE CROSS/BLUE SHIELD

## 2017-07-11 ENCOUNTER — Encounter (HOSPITAL_COMMUNITY): Payer: Self-pay | Admitting: Emergency Medicine

## 2017-07-11 ENCOUNTER — Emergency Department (HOSPITAL_COMMUNITY)
Admission: EM | Admit: 2017-07-11 | Discharge: 2017-07-11 | Disposition: A | Discharge status: Home / Self Care | Payer: BLUE CROSS/BLUE SHIELD | Attending: Emergency Medicine | Admitting: Emergency Medicine

## 2017-07-11 DIAGNOSIS — Z79899 Other long term (current) drug therapy: Secondary | ICD-10-CM | POA: Diagnosis not present

## 2017-07-11 DIAGNOSIS — B0229 Other postherpetic nervous system involvement: Secondary | ICD-10-CM | POA: Diagnosis not present

## 2017-07-11 DIAGNOSIS — E039 Hypothyroidism, unspecified: Secondary | ICD-10-CM | POA: Diagnosis not present

## 2017-07-11 DIAGNOSIS — Z8619 Personal history of other infectious and parasitic diseases: Secondary | ICD-10-CM | POA: Diagnosis not present

## 2017-07-11 DIAGNOSIS — Z9049 Acquired absence of other specified parts of digestive tract: Secondary | ICD-10-CM | POA: Insufficient documentation

## 2017-07-11 DIAGNOSIS — H9202 Otalgia, left ear: Secondary | ICD-10-CM | POA: Diagnosis present

## 2017-07-11 LAB — CBC
HCT: 42.5 % (ref 36.0–46.0)
Hemoglobin: 14.2 g/dL (ref 12.0–15.0)
MCH: 33.1 pg (ref 26.0–34.0)
MCHC: 33.4 g/dL (ref 30.0–36.0)
MCV: 99.1 fL (ref 78.0–100.0)
PLATELETS: 342 10*3/uL (ref 150–400)
RBC: 4.29 MIL/uL (ref 3.87–5.11)
RDW: 13.3 % (ref 11.5–15.5)
WBC: 8.4 10*3/uL (ref 4.0–10.5)

## 2017-07-11 LAB — BASIC METABOLIC PANEL
Anion gap: 7 (ref 5–15)
BUN: 14 mg/dL (ref 6–20)
CHLORIDE: 104 mmol/L (ref 101–111)
CO2: 30 mmol/L (ref 22–32)
CREATININE: 0.65 mg/dL (ref 0.44–1.00)
Calcium: 9.2 mg/dL (ref 8.9–10.3)
GFR calc Af Amer: >60 mL/min (ref >60)
GLUCOSE: 96 mg/dL (ref 65–99)
Potassium: 3.6 mmol/L (ref 3.5–5.1)
SODIUM: 141 mmol/L (ref 135–145)

## 2017-07-11 LAB — RAPID HIV SCREEN (HIV 1/2 AB+AG)
HIV 1/2 Antibodies: NONREACTIVE
HIV-1 P24 Antigen - HIV24: NONREACTIVE

## 2017-07-11 MED ORDER — VALACYCLOVIR HCL 1 G PO TABS: 1000.0000 mg | ORAL_TABLET | Freq: Three times a day (TID) | ORAL | 0 refills | Status: DC

## 2017-07-11 NOTE — Discharge Instructions (Signed)
Return if worsening headache, fever, or altered mental status. Continue valacyclovir with additional week as prescribed. You should be contacted for follow-up with infectious disease.

## 2017-07-11 NOTE — ED Notes (Signed)
Pt took 100 mg of gabapentin and valacyclovir. MD Ray aware.

## 2017-07-11 NOTE — ED Provider Notes (Signed)
[facility] DEPT Provider Note   CSN: [id number] Arrival date & time: 07/11/17  1303     History   Chief Complaint No chief complaint on file.   HPI Kristen Chan is a 46 y.o. female.  HPI    46 year old female history of anemia presents today complaining of right usual pain. 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 She was placed on antivirals for a week and got better. Symptoms resumed approximately 6 weeks later. She was again placed on anti-viral therapy for a week followed by a lower dose for 3 weeks. She felt better during that time. Approximately 5 days after going off of her valacyclovir she began having some pain again in the right ear. She states that she had a vesicle in it 3 days ago. Her husband as able to see this in the external canal.She has noted some pain in her left ear. Is complaining of some neck pain and headache. She has been referred by her primary care to follow-up with infectious disease but does not yet have an appointment. During the initial symptoms in June she was seen by ENT. She states she had 10% hearing loss.she describes the initial symptoms being pain in the right ear with vesicles that began in the right chin and were in the mouth than on the inside of the nose to her forehead.  Past Medical History:  Diagnosis Date  . Anemia    history of anemia  . Complication of anesthesia    patient requests lightweight anesthesia  . GERD (gastroesophageal reflux disease)   . Hypothyroidism     Patient Active Problem List   Diagnosis Date Noted  . Postoperative state 02/24/2015    Past Surgical History:  Procedure Laterality Date  . ABDOMINAL HYSTERECTOMY    . APPENDECTOMY    . CHOLECYSTECTOMY    . CYSTOSCOPY N/A 02/24/2015   Procedure: CYSTOSCOPY;  Surgeon: Bobbye Charleston, MD;  Location: [facility];  Service: Gynecology;  Laterality: N/A;  . KNEE ARTHROSCOPY    . MYOMECTOMY    . ROBOTIC ASSISTED BILATERAL SALPINGO  OOPHERECTOMY N/A 02/24/2015   Procedure: ROBOTIC ASSISTED BILATERAL SALPINGO OOPHORECTOMY;  Surgeon: Bobbye Charleston, MD;  Location: [facility];  Service: Gynecology;  Laterality: N/A;  . thumb joint stabiliztion    . WISDOM TOOTH EXTRACTION      OB History    No data available       Home Medications    Prior to Admission medications   Medication Sig Start Date End Date Taking? Authorizing Provider  amoxicillin-clavulanate (AUGMENTIN) 875-125 MG tablet Take 1 tablet by mouth every 12 (twelve) hours. 03/25/17   Nona Dell, PA-C  drospirenone-ethinyl estradiol Sherrill Raring) 3-0.02 MG tablet Take 1 tablet by mouth daily.    [provider]  fexofenadine (ALLEGRA) 180 MG tablet Take 180 mg by mouth daily.    [provider]  ibuprofen (ADVIL,MOTRIN) 200 MG tablet Take 600 mg by mouth every 6 (six) hours as needed for headache or mild pain.    [provider]  levothyroxine (SYNTHROID, LEVOTHROID) 50 MCG tablet Take 50 mcg by mouth daily before breakfast.    [provider]  Multiple Vitamin (MULTIVITAMIN WITH MINERALS) TABS tablet Take 1 tablet by mouth daily.    [provider]  omeprazole (PRILOSEC) 20 MG capsule Take 20 mg by mouth daily.    [provider]  oxymetazoline (AFRIN NASAL SPRAY) 0.05 % nasal spray Place 1 spray  into both nostrils 2 (two) times daily. Spray once into each nostril twice daily for up to the next 3 days. Do not use for more than 3 days to prevent rebound rhinorrhea. 03/25/17   Nona Dell, PA-C  traMADol (ULTRAM) 50 MG tablet Take 1 tablet (50 mg total) by mouth every 6 (six) hours as needed for moderate pain. 02/25/15   Bobbye Charleston, MD    Family History No family history on file.  Social History Social History  Substance Use Topics  . Smoking status: Never Smoker  . Smokeless tobacco: Never Used  . Alcohol use No     Allergies   Artichoke Adela Glimpse scolymus  (artichoke)]; Mango flavor; Penicillins; and Sulfa antibiotics   Review of Systems Review of Systems  All other systems reviewed and are negative.    Physical Exam Updated Vital Signs BP (!) 131/91 (BP Location: Left Arm)   Pulse 86   Temp 98 F (36.7 C) (Oral)   Ht 1.676 m (5\' 6" )   Wt 64.4 kg (142 lb)   LMP 04/22/2012   SpO2 100%   BMI 22.92 kg/m   Physical Exam  Constitutional: She is oriented to person, place, and time. She appears well-developed and well-nourished.  HENT:  Head: Normocephalic and atraumatic.  Right Ear: External ear normal.  Left Ear: External ear normal.  Bilateral external canals are visualized without any lesions noted. Tympanic membranes clear bilaterally. No lesions noted to ears or neck. Oropharynx is clear.  Eyes: Pupils are equal, round, and reactive to light. EOM are normal.  Neck: Normal range of motion. Neck supple. No thyromegaly present.  Cardiovascular: Normal rate, regular rhythm, normal heart sounds and intact distal pulses.   Pulmonary/Chest: Effort normal and breath sounds normal.  Abdominal: Soft. Bowel sounds are normal.  Musculoskeletal: Normal range of motion.  Neurological: She is alert and oriented to person, place, and time. She displays normal reflexes. No cranial nerve deficit. She exhibits normal muscle tone. Coordination normal.  Skin: Skin is warm and dry.  Pinpoint erythematous slightly raised lesion left posterior neck  Nursing note and vitals reviewed.    ED Treatments / Results  Labs (all labs ordered are listed, but only abnormal results are displayed) Labs Reviewed  BASIC METABOLIC PANEL  CBC    EKG  EKG Interpretation None       Radiology No results found.  Procedures Procedures (including critical care time)  Medications Ordered in ED Medications - No data to display   Initial Impression / Assessment and Plan / ED Course  I have reviewed the triage vital signs and the nursing  notes.  Pertinent labs & imaging results that were available during my care of the patient were reviewed by me and considered in my medical decision making (see chart for details).     This is a 46 year old lady who has had herpes involving the right ear in June. She reports postherpetic neuralgia that began in July and was treated with Neurontin. She has continued to have some pain in that right ear and a question of lesions recurring in August. She was restarted on valacyclovir and kept on antivirals for one month. During that time she felt better. During the pastseveral days since she has been off, she has begun to have some more pain in the right ear and thought she saw a vesicle several days ago. Secondary to this her doctor restarted her on valacyclovir. She presents today complaining of some pain in that right ear  with some pain in the neck which she describes as having occurred prior to the breakout last time. On my exam here she has no lesions noted. I examined both ears with the otoscope and unable to find any lesions. Neck is clear. Her neurological exam is normal and her neck is supple. I discussed her care with Dr. Tommy Medal. She had been referred to ID outpatient. She will have ae test here, both rapid and regular HIV antigen. She understands that the regular HIV antigen will not come back tonight. We have discussed return precautions including worsening vesicles, headache, neck pain, or confusion. She has follow-up with her primary care doctor. She is given a prescription for valacyclovir 1 g 3 times a day for an additional week. Final Clinical Impressions(s) / ED Diagnoses   Final diagnoses:  Post herpetic neuralgia    New Prescriptions New Prescriptions   No medications on file     Pattricia Boss, MD 07/12/17 1055

## 2017-07-11 NOTE — ED Triage Notes (Signed)
Pt reports hx of Ramsey Hunts shingles virus diagnosed June. Pt presented to ED with complaints of flare up. Complains of sharp shooting ear pain, facial numbness, tingling in R side of face and hand, head and neck pain, throat pain, dizziness, heavy arms, and headache started yesterday. Pt states the dizziness is worse today and is awaiting to see neurologist and ID. Pt reports compliance with antivirals medications.

## 2017-07-11 NOTE — ED Notes (Signed)
3 gold tops sent to main lab

## 2017-07-11 NOTE — Progress Notes (Signed)
      INFECTIOUS DISEASE ATTENDING ADDENDUM:   Date: 07/11/2017  Patient name: Kristen Chan  Medical record number: [id number]  Date of birth: [birth year]   Patient with 3rd episode of zoster since June, 2018.   She has been referred to our clinic 2 days ago by [facility].  Can we get her in with someone?    Alcide Evener 07/11/2017, 7:34 PM

## 2017-07-16 ENCOUNTER — Ambulatory Visit (INDEPENDENT_AMBULATORY_CARE_PROVIDER_SITE_OTHER): Payer: BLUE CROSS/BLUE SHIELD | Admitting: Internal Medicine

## 2017-07-16 ENCOUNTER — Encounter: Payer: Self-pay | Admitting: Internal Medicine

## 2017-07-16 DIAGNOSIS — B0221 Postherpetic geniculate ganglionitis: Secondary | ICD-10-CM

## 2017-07-16 NOTE — Progress Notes (Signed)
[facility]  Reason for Consult: Recent right cranial nerve zoster complicated by Ramsay Hunt syndrome and postherpetic neuralgia Referring Physician: Dr. Maurice Small  Assessment: I believe that she did have acute zoster complicated by right sided Ramsay Hunt syndrome in late June and early July but I am not convinced that she is having persistent or recurrent zoster since that time. I told her that recurrences are actually quite rare, especially in immunocompetent patients. I do not believe she has any underlying immunosuppressive disorder. The bulk of her problem seems to stem from postherpetic neuralgia. She has noted some benefit from gabapentin. I would suggest titrating the dose upwards to see if she can get more benefit with manageable or no side effects. I told her that there should be no ongoing need for valacyclovir or prednisone. I suggested that she stop both now.  I told her that she truly believes that she is developing new zoster lesions in the future it would be best to have them recultured rather than simply requesting more treatment. Again, I emphasized that recurrences are quite rare.  With regard to the potential benefit of vaccination, I suggest holding off on it for now. Although this is not been studied well, it is believed that zoster itself boosts the immune system through an amnestic response. The general recommendation is to vaccinate after age 56 and to wait 2-3 years after the episode of acute zoster. Therefore I told her to hold off on vaccination for several more years.  I also suspect that she may have an overlay of systemic exertion intolerance disorder, formerly chronic fatigue syndrome. She describes feeling worse and being more fatigued when she tries to exercise. I talked to her about trying to strike a balance between too much exercise and to little. I encouraged her to try to gradually resume her usual  activities.  Plan: 1. Discontinue valacyclovir 2. Recommend titrating gabapentin dose upward  3. Hold off on Shingrix vaccination for 3 years 4. Resume usual activities and exercise as tolerated 5. She may follow-up here as needed  Patient Active Problem List   Diagnosis Date Noted  . Ramsay Hunt syndrome (geniculate herpes zoster) 07/16/2017    Patient's Medications  New Prescriptions   No medications on file  Previous Medications   CLOBETASOL OINTMENT (TEMOVATE) 0.05 %    clobetasol 0.05 % topical ointment  Apply 1 application twice a day by topical route as directed, apply thin layer to affected area.   DROSPIRENONE-ETHINYL ESTRADIOL (YAZ,GIANVI,LORYNA) 3-0.02 MG TABLET    Take 1 tablet by mouth daily.   FEXOFENADINE (ALLEGRA) 180 MG TABLET    Take 180 mg by mouth daily.   GABAPENTIN (NEURONTIN) 100 MG CAPSULE    Take 100 mg by mouth 3 (three) times daily.   IBUPROFEN (ADVIL,MOTRIN) 200 MG TABLET    Take 600 mg by mouth every 6 (six) hours as needed for headache or mild pain.   LEVOTHYROXINE (SYNTHROID, LEVOTHROID) 75 MCG TABLET       MULTIPLE VITAMIN (MULTIVITAMIN WITH MINERALS) TABS TABLET    Take 1 tablet by mouth daily.   OMEPRAZOLE (PRILOSEC) 20 MG CAPSULE    Take 20 mg by mouth daily.   OXYMETAZOLINE (AFRIN NASAL SPRAY) 0.05 % NASAL SPRAY    Place 1 spray into both nostrils 2 (two) times daily. Spray once into each nostril twice daily for up to the next 3 days. Do not use for more than  3 days to prevent rebound rhinorrhea.   RANITIDINE (ZANTAC) 150 MG TABLET    Zantac 150 mg tablet  Take 1 tablet twice a day by oral route.   TRAMADOL (ULTRAM) 50 MG TABLET    Take 1 tablet (50 mg total) by mouth every 6 (six) hours as needed for moderate pain.  Modified Medications   No medications on file  Discontinued Medications   AMOXICILLIN-CLAVULANATE (AUGMENTIN) 875-125 MG TABLET    Take 1 tablet by mouth every 12 (twelve) hours.   LEVOTHYROXINE (SYNTHROID, LEVOTHROID) 50 MCG TABLET     Take 50 mcg by mouth daily before breakfast.   PREDNISONE (DELTASONE) 20 MG TABLET       VALACYCLOVIR (VALTREX) 1000 MG TABLET    Take 1 tablet (1,000 mg total) by mouth 3 (three) times daily.    HPI: Kristen Chan is a 46 y.o. female [employer] who has had a very complicated course since developing zoster in late June. Dr. Justin Mend has compiled the following, excellent summary of her course since that time. I have listed that below.  03/23/2017. She developed right ear pain and was seen at [facility] urgent care. She was treated with doxycycline for presumed otitis media.  03/25/2017. She was seen at the [facility] ED for worsening ear pain, blisters on the right side of her face and ulcers in her mouth. She was diagnosed with otitis media and aphthous ulcers. Doxycycline was changed to amoxicillin clavulanate.  03/27/2017. She was seen in her PCPs office for continued pain and blistering rash on the right side of her face and in her right external ear canal. Cultures were taken and were positive for cell zoster. There were negative for herpes simplex and bacteria. She was started on valacyclovir 1 g 3 times daily for 10 days and referred for ophthalmologic exam which was normal.  03/29/2017. She was seen again and was noted to have decreased hearing on the right side and right tearing. She also had some right eyelid droop. She was treated with prednisone 60 mg 3 times daily for 5 days.   04/05/2017. She was prescribed gabapentin and tramadol as needed for postherpetic neuralgia and referred for ENT evaluation by Dr. Radene Journey who confirmed Virl Axe syndrome.  04/19/2017. She called her PCP complaining of worsening facial numbness and drooping and was prescribed another round of prednisone which she felt helped.  05/03/2017. She was doing better except for continued ear pain when seen for her wellness visit.  05/21/2017. She was seen at [facility] walk-in clinic complaining of recurrent  rash on her face. She was prescribed triamcinolone ointment and Zyrtec.  05/28/2017. She called again complaining of more rash on her face, right ear and in her throat. She wanted another round of valacyclovir and prednisone.  05/30/2017. She called again with concerned that shingles was spreading to her right eye. She had itching and burning. She saw her eye doctor again and the exam was reportedly normal. Her eye doctor said that he was surprised she had not been put on a longer course of valacyclovir.  06/26/2017. She called again and requested a prescription for the new Shingrix vaccine.   07/04/2017.  She was seen again with concerns that she had new lesions in her right ear. Her exam was felt to be normal but she was started back on valacyclovir and prednisone.  07/11/2017. She was seen in the ED with worsening pain in her right ear, right face and right neck. No lesions  were noted on exam. Her bowel acyclovir was extended for 1 week.  Today she describes a variety of aches and pains. She says that she has headache, her mouth hurts, her right posterior mandibular molar hurts, and right neck pain. She is now having some pain in her left ear and left neck. She states that the pains come and go. Her husband tells me that he notices her mentioning pain every day and she frequently will place her hand on the right side of her face when she gets home from work. She feels like the gabapentin has helped some. She does not like taking the tramadol. She is worried that she may have some problem with her immune system. She has given up most of her usual activities outside of work. She is no longer getting any regular exercise.  Review of Systems: Review of Systems  Constitutional: Positive for malaise/fatigue. Negative for chills, diaphoresis, fever and weight loss.  HENT: Positive for ear pain and hearing loss. Negative for sore throat.   Eyes: Negative for blurred vision, pain and redness.   Respiratory: Negative for cough, sputum production and shortness of breath.   Cardiovascular: Negative for chest pain.  Gastrointestinal: Negative for abdominal pain, diarrhea, heartburn, nausea and vomiting.  Genitourinary: Negative for dysuria and frequency.  Musculoskeletal: Positive for myalgias and neck pain. Negative for joint pain.  Skin: Negative for rash.  Neurological: Positive for headaches. Negative for dizziness and focal weakness.      Past Medical History:  Diagnosis Date  . Anemia    history of anemia  . Complication of anesthesia    patient requests lightweight anesthesia  . GERD (gastroesophageal reflux disease)   . Hypothyroidism     Social History  Substance Use Topics  . Smoking status: Never Smoker  . Smokeless tobacco: Never Used  . Alcohol use No    No family history on file. Allergies  Allergen Reactions  . Artichoke [Cynara Scolymus (Artichoke)] Itching and Swelling    Throat swelling and itching, caused by severe poison ivy allergy.  Luretha Rued Flavor Itching and Swelling    Throat swelling and itching, caused by severe poison ivy allergy.   . Penicillins Rash  . Sulfa Antibiotics Rash    OBJECTIVE: Vitals:   07/16/17 1511  BP: 111/76  Pulse: 79  Temp: 98.4 F (36.9 C)  TempSrc: Oral  Weight: 146 lb (66.2 kg)  Height: 5\' 6"  (1.676 m)   Body mass index is 23.57 kg/m.   Physical Exam  Constitutional: She is oriented to person, place, and time.  She is in no distress. She is accompanied by her husband.  HENT:  Right Ear: External ear normal.  Left Ear: External ear normal.  Mouth/Throat: Oropharynx is clear and moist. No oropharyngeal exudate.  She has no active lesions.  Eyes: Conjunctivae are normal.  Neck: Neck supple.  Cardiovascular: Normal rate and regular rhythm.   No murmur heard. Pulmonary/Chest: Effort normal and breath sounds normal.  Abdominal: Soft. She exhibits no mass. There is no tenderness.  Musculoskeletal:  Normal range of motion.  Lymphadenopathy:       Head (right side): No submandibular, no preauricular and no posterior auricular adenopathy present.       Head (left side): No submandibular, no preauricular and no posterior auricular adenopathy present.    She has no cervical adenopathy.  Neurological: She is alert and oriented to person, place, and time.  Skin: No rash noted.  She has a few healing  scars on the right side of her face.  Psychiatric: Mood and affect normal.    Microbiology: No results found for this or any previous visit (from the past 240 hour(s)).  Michel Bickers, MD [facility] for [facility] [phone] pager   [phone] cell 07/16/2017, 4:28 PM

## 2017-07-18 ENCOUNTER — Encounter: Payer: Self-pay | Admitting: Neurology

## 2017-07-20 ENCOUNTER — Ambulatory Visit (INDEPENDENT_AMBULATORY_CARE_PROVIDER_SITE_OTHER): Payer: BLUE CROSS/BLUE SHIELD | Admitting: Neurology

## 2017-07-20 ENCOUNTER — Encounter: Payer: Self-pay | Admitting: Neurology

## 2017-07-20 VITALS — BP 110/80 | HR 100 | Ht 66.0 in | Wt 142.6 lb

## 2017-07-20 DIAGNOSIS — B0229 Other postherpetic nervous system involvement: Secondary | ICD-10-CM

## 2017-07-20 DIAGNOSIS — B0221 Postherpetic geniculate ganglionitis: Secondary | ICD-10-CM

## 2017-07-20 MED ORDER — GABAPENTIN (ONCE-DAILY) 300 & 600 MG PO MISC: 1.0000 | ORAL | 0 refills | Status: AC

## 2017-07-20 NOTE — Patient Instructions (Addendum)
1.  Stop the regular gabapentin and instead start the Gralise (gabapentin) starter pack.  It's once a day with dinner.  Contact me when you need a refill. 2.  Limit use of Advil to no more than 2 days out of the week 3.  Follow up in about 8 to 10 weeks

## 2017-07-20 NOTE — Progress Notes (Signed)
NEUROLOGY CONSULTATION NOTE  Kristen Chan MRN: [id number] DOB: 1971-02-06  Referring provider: Dr. Justin Mend Primary care provider: Dr. Justin Mend  Reason for consult:  Ramsay Hunt Syndrome/post-herpetic neuralgia  HISTORY OF PRESENT ILLNESS: Kristen Chan is a 46 year old female with hypothyroidism and anemia who presents for facial pain and numbness.  In June, she presented to the ED with congestion and right ear pain radiating into the jaw.  Exam revealed bulging erythematous TM with middle ear effusion and was diagnosed with otitis media.  She was treated with doxycycline and Augmentin.  She later developed a vesicular rash on the right side of her face and external ear canal as well as decreased hearing in the right ear, mild right sided facial weakness and tearing of the right eye.  Cultures were positive for zoster.  She was subsequently diagnosed with Ramsay Hunt Syndrome and placed on valacyclovir and prednisone.  She was placed on gabapentin as well as tramadol for breakthrough pain.  In August, she developed increased right ear pain and noted new lesion in the ear.  She was evaluated by ENT, who noted old scabbed over lesions in her ear but not new ones.  She was also diagnosed with mild sensorineural hearing loss in the right ear.  She was placed on an extended dose of valacyclovir.  In early October, she developed recurrence of right ear pain as well as left ear pain.  She also developed daily holocephalic vice-like headache lasting 30 to 60 minutes and occurring daily.  She also endorsed other systemic symptoms such as fatigue, generalized body aches, numbness in the hands, and nausea but no vomiting.   She denied fever.  She would take Advil occasionally for headache, which helped.  CT of head from 07/11/17 was personally reviewed and was normal.  She has also been evaluated by ID who stated that recurrent active zoster flare-ups are rare and that she is likely experiencing postherpetic  neuralgia.  He discontinued valacyclovir.  CBC and BMP were normal.  HIV screen was negative.   She is currently taking gabapentin 100mg  three times daily which helps.  PAST MEDICAL HISTORY: Past Medical History:  Diagnosis Date  . Anemia    history of anemia  . Complication of anesthesia    patient requests lightweight anesthesia  . GERD (gastroesophageal reflux disease)   . Hypothyroidism     PAST SURGICAL HISTORY: Past Surgical History:  Procedure Laterality Date  . ABDOMINAL HYSTERECTOMY    . APPENDECTOMY    . CHOLECYSTECTOMY    . CYSTOSCOPY N/A 02/24/2015   Procedure: CYSTOSCOPY;  Surgeon: Bobbye Charleston, MD;  Location: [facility];  Service: Gynecology;  Laterality: N/A;  . KNEE ARTHROSCOPY    . MYOMECTOMY    . ROBOTIC ASSISTED BILATERAL SALPINGO OOPHERECTOMY N/A 02/24/2015   Procedure: ROBOTIC ASSISTED BILATERAL SALPINGO OOPHORECTOMY;  Surgeon: Bobbye Charleston, MD;  Location: [facility];  Service: Gynecology;  Laterality: N/A;  . thumb joint stabiliztion    . WISDOM TOOTH EXTRACTION      MEDICATIONS: Current Outpatient Prescriptions on File Prior to Visit  Medication Sig Dispense Refill  . clobetasol ointment (TEMOVATE) 0.05 % clobetasol 0.05 % topical ointment  Apply 1 application twice a day by topical route as directed, apply thin layer to affected area.    . drospirenone-ethinyl estradiol (YAZ,GIANVI,LORYNA) 3-0.02 MG tablet Take 1 tablet by mouth daily.    . fexofenadine (ALLEGRA) 180 MG tablet Take 180 mg by mouth daily.    Marland Kitchen gabapentin (  NEURONTIN) 100 MG capsule Take 100 mg by mouth 3 (three) times daily.  1  . ibuprofen (ADVIL,MOTRIN) 200 MG tablet Take 600 mg by mouth every 6 (six) hours as needed for headache or mild pain.    Marland Kitchen levothyroxine (SYNTHROID, LEVOTHROID) 75 MCG tablet   0  . Multiple Vitamin (MULTIVITAMIN WITH MINERALS) TABS tablet Take 1 tablet by mouth daily.    Marland Kitchen omeprazole (PRILOSEC) 20 MG capsule Take 20 mg by mouth daily.    Marland Kitchen oxymetazoline (AFRIN  NASAL SPRAY) 0.05 % nasal spray Place 1 spray into both nostrils 2 (two) times daily. Spray once into each nostril twice daily for up to the next 3 days. Do not use for more than 3 days to prevent rebound rhinorrhea. 30 mL 0  . ranitidine (ZANTAC) 150 MG tablet Zantac 150 mg tablet  Take 1 tablet twice a day by oral route.    . traMADol (ULTRAM) 50 MG tablet Take 1 tablet (50 mg total) by mouth every 6 (six) hours as needed for moderate pain. (Patient not taking: Reported on 07/16/2017) 30 tablet 1   No current facility-administered medications on file prior to visit.     ALLERGIES: Allergies  Allergen Reactions  . Artichoke [Cynara Scolymus (Artichoke)] Itching and Swelling    Throat swelling and itching, caused by severe poison ivy allergy.  Luretha Rued Flavor Itching and Swelling    Throat swelling and itching, caused by severe poison ivy allergy.   . Penicillins Rash  . Sulfa Antibiotics Rash    FAMILY HISTORY: History reviewed. No pertinent family history.  SOCIAL HISTORY: Social History   Social History  . Marital status: Married    Spouse name: N/A  . Number of children: N/A  . Years of education: N/A   Occupational History  . Not on file.   Social History Main Topics  . Smoking status: Never Smoker  . Smokeless tobacco: Never Used  . Alcohol use No  . Drug use: No  . Sexual activity: Not on file   Other Topics Concern  . Not on file   Social History Narrative  . No narrative on file    REVIEW OF SYSTEMS: Constitutional: No fevers, chills, or sweats, no generalized fatigue, change in appetite Eyes: No visual changes, double vision, eye pain Ear, nose and throat: ear pain Cardiovascular: No chest pain, palpitations Respiratory:  No shortness of breath at rest or with exertion, wheezes GastrointestinaI: occasional nausea Genitourinary:  No dysuria, urinary retention or frequency Musculoskeletal:  Neck and shoulder aches Integumentary: very small bump on  forehead and right side of neck Neurological: as above Psychiatric: No depression, insomnia, anxiety Endocrine: No palpitations, fatigue, diaphoresis, mood swings, change in appetite, change in weight, increased thirst Hematologic/Lymphatic:  No purpura, petechiae. Allergic/Immunologic: no itchy/runny eyes, nasal congestion, recent allergic reactions, rashes  PHYSICAL EXAM: Vitals:   07/20/17 1455  BP: 110/80  Pulse: 100  SpO2: 98%   General: No acute distress.  Patient appears well-groomed.  Head:  Normocephalic/atraumatic Eyes:  fundi examined but not visualized Neck: supple, no paraspinal tenderness, full range of motion Back: No paraspinal tenderness Heart: regular rate and rhythm Lungs: Clear to auscultation bilaterally. Vascular: No carotid bruits. Neurological Exam: Mental status: alert and oriented to person, place, and time, recent and remote memory intact, fund of knowledge intact, attention and concentration intact, speech fluent and not dysarthric, language intact. Cranial nerves: CN I: not tested CN II: pupils equal, round and reactive to light, visual fields intact  CN III, IV, VI:  full range of motion, no nystagmus, no ptosis CN V: facial sensation intact CN VII: maybe very mild flattening of right nasolabial fold CN VIII: hearing intact CN IX, X: gag intact, uvula midline CN XI: sternocleidomastoid and trapezius muscles intact CN XII: tongue midline Bulk & Tone: normal, no fasciculations. Motor:  5/5 throughout  Sensation: temperature and vibration sensation intact. Deep Tendon Reflexes:  2+ throughout, toes downgoing.  Finger to nose testing:  Without dysmetria.  Heel to shin:  Without dysmetria.  Gait:  Normal station and stride.  Able to turn and tandem walk. Romberg negative.  IMPRESSION: 1.  Ramsay Hunt syndrome with post-herpetic neuralgia. She had associated vesicular rash and positive cultures.  Her neurologic exam is non-focal and does not suggest  another intracranial etiology.  She endorses some pain in the left ear now but without any objective focal abnormalities. 2.  Probable tension-type headaches 3.  Various systemic symptoms, in addition to headache, including nausea, generalized fatigue and body aches.  Possibly viral-related but not particularly neurologic.  Complications of zoster due include meningitis and stroke.  Given the classic presentation and exam findings of vesicular rash, absence of fever, and non-focal exam, further testing, such as with MRI of brain, is not indicated at this time.  PLAN: 1.  We will start her on Gralise and titrate her to recommended goal of 1800mg  daily at dinner, as it is indicated for post-herpetic neuralgia. 2.  She will follow up in 8-10 weeks  Thank you for allowing me to take part in the care of this patient.  Metta Clines, DO  CC: Maurice Small, MD

## 2017-08-08 ENCOUNTER — Telehealth: Payer: Self-pay

## 2017-08-08 NOTE — Telephone Encounter (Signed)
Message left for patient to call our office to schedule appointment.   Referral from [place] per Dr Tommy Medal.   Laverle Patter, RN

## 2017-08-27 DIAGNOSIS — B0229 Other postherpetic nervous system involvement: Secondary | ICD-10-CM | POA: Diagnosis not present

## 2017-08-27 DIAGNOSIS — B0221 Postherpetic geniculate ganglionitis: Secondary | ICD-10-CM | POA: Diagnosis not present

## 2017-09-10 ENCOUNTER — Ambulatory Visit: Payer: BLUE CROSS/BLUE SHIELD | Admitting: Neurology

## 2017-09-12 DIAGNOSIS — H524 Presbyopia: Secondary | ICD-10-CM | POA: Diagnosis not present

## 2017-09-13 DIAGNOSIS — L905 Scar conditions and fibrosis of skin: Secondary | ICD-10-CM | POA: Diagnosis not present

## 2017-09-13 DIAGNOSIS — D2262 Melanocytic nevi of left upper limb, including shoulder: Secondary | ICD-10-CM | POA: Diagnosis not present

## 2017-09-13 DIAGNOSIS — D2261 Melanocytic nevi of right upper limb, including shoulder: Secondary | ICD-10-CM | POA: Diagnosis not present

## 2017-09-13 DIAGNOSIS — D1801 Hemangioma of skin and subcutaneous tissue: Secondary | ICD-10-CM | POA: Diagnosis not present

## 2017-09-19 ENCOUNTER — Encounter: Payer: Self-pay | Admitting: Neurology

## 2017-09-19 ENCOUNTER — Ambulatory Visit (INDEPENDENT_AMBULATORY_CARE_PROVIDER_SITE_OTHER): Payer: BLUE CROSS/BLUE SHIELD | Admitting: Neurology

## 2017-09-19 VITALS — BP 110/72 | HR 76 | Ht 66.0 in | Wt 141.8 lb

## 2017-09-19 DIAGNOSIS — S60022A Contusion of left index finger without damage to nail, initial encounter: Secondary | ICD-10-CM | POA: Diagnosis not present

## 2017-09-19 DIAGNOSIS — B0221 Postherpetic geniculate ganglionitis: Secondary | ICD-10-CM

## 2017-09-19 DIAGNOSIS — B0229 Other postherpetic nervous system involvement: Secondary | ICD-10-CM | POA: Diagnosis not present

## 2017-09-19 MED ORDER — GABAPENTIN 300 MG PO CAPS: 600.0000 mg | ORAL_CAPSULE | Freq: Three times a day (TID) | ORAL | 2 refills | Status: AC

## 2017-09-19 NOTE — Progress Notes (Signed)
NEUROLOGY FOLLOW UP OFFICE NOTE  Kristen Chan [id number]  HISTORY OF PRESENT ILLNESS: Kristen Chan is a 46 year old female with hypothyroidism and anemia who follows up for post-herpetic Ramsay Hunt syndrome.  UPDATE: For post-herpetic neuralgia, she was started on Gralise, titrated to 1800mg  daily at dinner.  However, she was concerned about the high doses, so she continued generic gabapentin.  She is taking 300mg  three times daily.  Overall, pain is controlled.  Once in a while she may have breakthrough pain.  If she eats chewy food, it may trigger pain.  She has constant soreness/tenderness in the occipital region.   HISTORY: In June 2018, she presented to the ED with congestion and right ear pain radiating into the jaw.  Exam revealed bulging erythematous TM with middle ear effusion and was diagnosed with otitis media.  She was treated with doxycycline and Augmentin.  She later developed a vesicular rash on the right side of her face and external ear canal as well as decreased hearing in the right ear, mild right sided facial weakness and tearing of the right eye.  Cultures were positive for zoster.  She was subsequently diagnosed with Ramsay Hunt Syndrome and placed on valacyclovir and prednisone.  She was placed on gabapentin as well as tramadol for breakthrough pain.  In August, she developed increased right ear pain and noted new lesion in the ear.  She was evaluated by ENT, who noted old scabbed over lesions in her ear but not new ones.  She was also diagnosed with mild sensorineural hearing loss in the right ear.  She was placed on an extended dose of valacyclovir.  In early October, she developed recurrence of right ear pain as well as left ear pain.  She also developed daily holocephalic vice-like headache lasting 30 to 60 minutes and occurring daily.  She also endorsed other systemic symptoms such as fatigue, generalized body aches, numbness in the hands, and nausea but no vomiting.    She denied fever.  She would take Advil occasionally for headache, which helped.  CT of head from 07/11/17 was personally reviewed and was normal.  She has also been evaluated by ID who stated that recurrent active zoster flare-ups are rare and that she is likely experiencing postherpetic neuralgia.  He discontinued valacyclovir.  CBC and BMP were normal.  HIV screen was negative.   PAST MEDICAL HISTORY: Past Medical History:  Diagnosis Date  . Anemia    history of anemia  . Complication of anesthesia    patient requests lightweight anesthesia  . GERD (gastroesophageal reflux disease)   . Hypothyroidism     MEDICATIONS: Current Outpatient Medications on File Prior to Visit  Medication Sig Dispense Refill  . clobetasol ointment (TEMOVATE) 0.05 % clobetasol 0.05 % topical ointment  Apply 1 application twice a day by topical route as directed, apply thin layer to affected area.    . drospirenone-ethinyl estradiol (YAZ,GIANVI,LORYNA) 3-0.02 MG tablet Take 1 tablet by mouth daily.    . fexofenadine (ALLEGRA) 180 MG tablet Take 180 mg by mouth daily.    . Gabapentin, Once-Daily, 300 & 600 MG MISC Take 1 tablet by mouth as directed. 1 each 0  . ibuprofen (ADVIL,MOTRIN) 200 MG tablet Take 600 mg by mouth every 6 (six) hours as needed for headache or mild pain.    Marland Kitchen levothyroxine (SYNTHROID, LEVOTHROID) 75 MCG tablet   0  . Multiple Vitamin (MULTIVITAMIN WITH MINERALS) TABS tablet Take 1 tablet by mouth  daily.    . omeprazole (PRILOSEC) 20 MG capsule Take 20 mg by mouth daily.    Marland Kitchen oxymetazoline (AFRIN NASAL SPRAY) 0.05 % nasal spray Place 1 spray into both nostrils 2 (two) times daily. Spray once into each nostril twice daily for up to the next 3 days. Do not use for more than 3 days to prevent rebound rhinorrhea. 30 mL 0  . ranitidine (ZANTAC) 150 MG tablet Zantac 150 mg tablet  Take 1 tablet twice a day by oral route.    . traMADol (ULTRAM) 50 MG tablet Take 1 tablet (50 mg total) by mouth  every 6 (six) hours as needed for moderate pain. (Patient not taking: Reported on 07/16/2017) 30 tablet 1   No current facility-administered medications on file prior to visit.     ALLERGIES: Allergies  Allergen Reactions  . Artichoke [Cynara Scolymus (Artichoke)] Itching and Swelling    Throat swelling and itching, caused by severe poison ivy allergy.  Luretha Rued Flavor Itching and Swelling    Throat swelling and itching, caused by severe poison ivy allergy.   . Penicillins Rash  . Sulfa Antibiotics Rash    FAMILY HISTORY: History reviewed. No pertinent family history.  SOCIAL HISTORY: Social History   Socioeconomic History  . Marital status: Married    Spouse name: Not on file  . Number of children: Not on file  . Years of education: Not on file  . Highest education level: Not on file  Social Needs  . Financial resource strain: Not on file  . Food insecurity - worry: Not on file  . Food insecurity - inability: Not on file  . Transportation needs - medical: Not on file  . Transportation needs - non-medical: Not on file  Occupational History  . Not on file  Tobacco Use  . Smoking status: Never Smoker  . Smokeless tobacco: Never Used  Substance and Sexual Activity  . Alcohol use: No  . Drug use: No  . Sexual activity: Not on file  Other Topics Concern  . Not on file  Social History Narrative  . Not on file    REVIEW OF SYSTEMS: Constitutional: No fevers, chills, or sweats, no generalized fatigue, change in appetite Eyes: No visual changes, double vision, eye pain Ear, nose and throat: No hearing loss, ear pain, nasal congestion, sore throat Cardiovascular: No chest pain, palpitations Respiratory:  No shortness of breath at rest or with exertion, wheezes GastrointestinaI: No nausea, vomiting, diarrhea, abdominal pain, fecal incontinence Genitourinary:  No dysuria, urinary retention or frequency Musculoskeletal:  No neck pain, back pain Integumentary: No rash,  pruritus, skin lesions Neurological: as above Psychiatric: No depression, insomnia, anxiety Endocrine: No palpitations, fatigue, diaphoresis, mood swings, change in appetite, change in weight, increased thirst Hematologic/Lymphatic:  No purpura, petechiae. Allergic/Immunologic: no itchy/runny eyes, nasal congestion, recent allergic reactions, rashes  PHYSICAL EXAM: Vitals:   09/19/17 1329  BP: 110/72  Pulse: 76  SpO2: 98%   General: No acute distress.  Patient appears well-groomed.   Head:  Normocephalic/atraumatic Eyes:  Fundi examined but not visualized Neurological Exam: alert and oriented to person, place, and time. Attention span and concentration intact, recent and remote memory intact, fund of knowledge intact.  Speech fluent and not dysarthric, language intact.  Flattening of right nasolabial fold.  Mildly decreased hearing in right ear.  Otherwise, CN II-XII intact. Bulk and tone normal, muscle strength 5/5 throughout.  Sensation to light touch intact.  Deep tendon reflexes 2+ throughout, toes downgoing.  Finger to nose  testing intact.  Gait normal, Romberg negative.  IMPRESSION: Ramsay Hunt syndrome with post-herpetic neuralgia.  She has some residual pain but overall improved.  PLAN: To better treat pain, we will titrate gabapentin to 600mg  three times daily.  She will contact me if we need to make other adjustments.  Otherwise, follow up as needed.  19 minutes spent face to face with patient, over 50% spent discussing diagnosis and management.  Metta Clines, DO  CC:  Maurice Small, MD

## 2017-09-19 NOTE — Patient Instructions (Signed)
Increase gabapentin 300mg  to 2 capsules (600mg  total) three times daily.  If doing well, further refills may be prescribed by your PCP.  Otherwise, contact our office.

## 2017-10-19 ENCOUNTER — Ambulatory Visit: Payer: BLUE CROSS/BLUE SHIELD | Admitting: Neurology

## 2017-11-23 DIAGNOSIS — Z8619 Personal history of other infectious and parasitic diseases: Secondary | ICD-10-CM | POA: Diagnosis not present

## 2017-11-23 DIAGNOSIS — M26621 Arthralgia of right temporomandibular joint: Secondary | ICD-10-CM | POA: Diagnosis not present

## 2018-01-09 ENCOUNTER — Other Ambulatory Visit: Payer: Self-pay

## 2018-01-09 ENCOUNTER — Telehealth: Payer: Self-pay | Admitting: Neurology

## 2018-01-09 MED ORDER — GABAPENTIN 300 MG PO CAPS: 300.0000 mg | ORAL_CAPSULE | Freq: Two times a day (BID) | ORAL | 3 refills | Status: AC

## 2018-01-09 NOTE — Telephone Encounter (Signed)
*  STAT* If patient is at the pharmacy, call can be transferred to refill team.  1.     Which medications need to be refilled? (please list name of each medication and dose if know) Gabapentin  2.     Which pharmacy/location (including street and city if local pharmacy) is medication to be sent to?   3.     Do they need a 30 or 90 day supply?

## 2018-01-22 DIAGNOSIS — Z01419 Encounter for gynecological examination (general) (routine) without abnormal findings: Secondary | ICD-10-CM | POA: Diagnosis not present

## 2018-01-22 DIAGNOSIS — Z1231 Encounter for screening mammogram for malignant neoplasm of breast: Secondary | ICD-10-CM | POA: Diagnosis not present

## 2018-01-22 DIAGNOSIS — Z6822 Body mass index (BMI) 22.0-22.9, adult: Secondary | ICD-10-CM | POA: Diagnosis not present

## 2018-01-23 ENCOUNTER — Other Ambulatory Visit: Payer: Self-pay | Admitting: Obstetrics and Gynecology

## 2018-01-23 DIAGNOSIS — E2839 Other primary ovarian failure: Secondary | ICD-10-CM

## 2018-01-24 ENCOUNTER — Encounter: Payer: Self-pay | Admitting: Neurology

## 2018-02-26 ENCOUNTER — Ambulatory Visit
Admission: RE | Admit: 2018-02-26 | Discharge: 2018-02-26 | Disposition: A | Discharge status: Home / Self Care | Payer: BLUE CROSS/BLUE SHIELD | Source: Ambulatory Visit | Attending: Obstetrics and Gynecology | Admitting: Obstetrics and Gynecology

## 2018-02-26 DIAGNOSIS — Z13820 Encounter for screening for osteoporosis: Secondary | ICD-10-CM | POA: Diagnosis not present

## 2018-02-26 DIAGNOSIS — E2839 Other primary ovarian failure: Secondary | ICD-10-CM

## 2018-02-26 DIAGNOSIS — M8589 Other specified disorders of bone density and structure, multiple sites: Secondary | ICD-10-CM | POA: Diagnosis not present

## 2018-02-26 DIAGNOSIS — Z780 Asymptomatic menopausal state: Secondary | ICD-10-CM | POA: Diagnosis not present

## 2018-03-04 ENCOUNTER — Encounter: Payer: Self-pay | Admitting: Neurology

## 2018-03-04 ENCOUNTER — Ambulatory Visit (INDEPENDENT_AMBULATORY_CARE_PROVIDER_SITE_OTHER): Payer: BLUE CROSS/BLUE SHIELD | Admitting: Neurology

## 2018-03-04 VITALS — BP 104/68 | HR 69 | Ht 66.0 in | Wt 141.0 lb

## 2018-03-04 DIAGNOSIS — B0229 Other postherpetic nervous system involvement: Secondary | ICD-10-CM

## 2018-03-04 DIAGNOSIS — M542 Cervicalgia: Secondary | ICD-10-CM | POA: Diagnosis not present

## 2018-03-04 DIAGNOSIS — M5481 Occipital neuralgia: Secondary | ICD-10-CM

## 2018-03-04 DIAGNOSIS — B0221 Postherpetic geniculate ganglionitis: Secondary | ICD-10-CM | POA: Diagnosis not present

## 2018-03-04 DIAGNOSIS — R6884 Jaw pain: Secondary | ICD-10-CM | POA: Diagnosis not present

## 2018-03-04 MED ORDER — GABAPENTIN (ONCE-DAILY) 600 MG PO TABS: 600.0000 mg | ORAL_TABLET | Freq: Two times a day (BID) | ORAL | 3 refills | Status: AC

## 2018-03-04 NOTE — Progress Notes (Signed)
NEUROLOGY FOLLOW UP OFFICE NOTE  Kristen Chan [id number]  HISTORY OF PRESENT ILLNESS: Kristen Chan is a 47  year old female with hypothyroidism and anemia who follows up for post-herpetic Ramsay Hunt syndrome.   UPDATE: For post-herpetic neuralgia, she takes gabapentin 600mg  three times daily. Pain was well-controlled except she would have breakthrough pain.  She tried stopping the gabapentin but the pain returned.  When she ran out of gabapentin, she returned to the Impact (since she was taking 1800mg  of gabapentin anyway).  She restarted the titration and is currently on 900mg .  She thinks it is more effective than regular gabapentin because it keeps a steady dose throughout the day and preventing breakthrough pain.  She also reports continued twitching of the face.  She now reports burning sensation across the occipital region.  She reports neck pain and right sided jaw pain.  She also reports burning pain across the back at the level of her bra strap.   HISTORY: In June 2018, she presented to the ED with congestion and right ear pain radiating into the jaw.  Exam revealed bulging erythematous TM with middle ear effusion and was diagnosed with otitis media.  She was treated with doxycycline and Augmentin.  She later developed a vesicular rash on the right side of her face and external ear canal as well as decreased hearing in the right ear, mild right sided facial weakness and tearing of the right eye.  Cultures were positive for zoster.  She was subsequently diagnosed with Ramsay Hunt Syndrome and placed on valacyclovir and prednisone.  She was placed on gabapentin as well as tramadol for breakthrough pain.  In August, she developed increased right ear pain and noted new lesion in the ear.  She was evaluated by ENT, who noted old scabbed over lesions in her ear but not new ones.  She was also diagnosed with mild sensorineural hearing loss in the right ear.  She was placed on an extended dose  of valacyclovir.  In early October, she developed recurrence of right ear pain as well as left ear pain.  She also developed daily holocephalic vice-like headache lasting 30 to 60 minutes and occurring daily.  She also endorsed other systemic symptoms such as fatigue, generalized body aches, numbness in the hands, and nausea but no vomiting.   She denied fever.  She would take Advil occasionally for headache, which helped.  CT of head from 07/11/17 was personally reviewed and was normal.  She has also been evaluated by ID who stated that recurrent active zoster flare-ups are rare and that she is likely experiencing postherpetic neuralgia.  He discontinued valacyclovir.  CBC and BMP were normal.  HIV screen was negative.  PAST MEDICAL HISTORY: Past Medical History:  Diagnosis Date  . Anemia    history of anemia  . Complication of anesthesia    patient requests lightweight anesthesia  . GERD (gastroesophageal reflux disease)   . Hypothyroidism     MEDICATIONS: Current Outpatient Medications on File Prior to Visit  Medication Sig Dispense Refill  . clobetasol ointment (TEMOVATE) 0.05 % clobetasol 0.05 % topical ointment  Apply 1 application twice a day by topical route as directed, apply thin layer to affected area.    . drospirenone-ethinyl estradiol (YAZ,GIANVI,LORYNA) 3-0.02 MG tablet Take 1 tablet by mouth daily.    Marland Kitchen estradiol (CLIMARA - DOSED IN MG/24 HR) 0.0375 mg/24hr patch Place 1 patch onto the skin daily.  4  . fexofenadine (ALLEGRA) 180  MG tablet Take 180 mg by mouth daily.    Marland Kitchen gabapentin (NEURONTIN) 300 MG capsule Take 2 capsules (600 mg total) by mouth 3 (three) times daily. 180 capsule 2  . gabapentin (NEURONTIN) 300 MG capsule Take 1 capsule (300 mg total) by mouth 2 (two) times daily. 60 capsule 3  . Gabapentin, Once-Daily, 300 & 600 MG MISC Take 1 tablet by mouth as directed. 1 each 0  . ibuprofen (ADVIL,MOTRIN) 200 MG tablet Take 600 mg by mouth every 6 (six) hours as needed  for headache or mild pain.    Marland Kitchen levothyroxine (SYNTHROID, LEVOTHROID) 75 MCG tablet   0  . Multiple Vitamin (MULTIVITAMIN WITH MINERALS) TABS tablet Take 1 tablet by mouth daily.    Marland Kitchen omeprazole (PRILOSEC) 20 MG capsule Take 20 mg by mouth daily.    Marland Kitchen oxymetazoline (AFRIN NASAL SPRAY) 0.05 % nasal spray Place 1 spray into both nostrils 2 (two) times daily. Spray once into each nostril twice daily for up to the next 3 days. Do not use for more than 3 days to prevent rebound rhinorrhea. 30 mL 0  . ranitidine (ZANTAC) 150 MG tablet Zantac 150 mg tablet  Take 1 tablet twice a day by oral route.    . traMADol (ULTRAM) 50 MG tablet Take 1 tablet (50 mg total) by mouth every 6 (six) hours as needed for moderate pain. (Patient not taking: Reported on 07/16/2017) 30 tablet 1   No current facility-administered medications on file prior to visit.     ALLERGIES: Allergies  Allergen Reactions  . Artichoke [Cynara Scolymus (Artichoke)] Itching and Swelling    Throat swelling and itching, caused by severe poison ivy allergy.  Luretha Rued Flavor Itching and Swelling    Throat swelling and itching, caused by severe poison ivy allergy.   . Penicillins Rash  . Sulfa Antibiotics Rash    FAMILY HISTORY: History reviewed. No pertinent family history.  SOCIAL HISTORY: Social History   Socioeconomic History  . Marital status: Married    Spouse name: Not on file  . Number of children: Not on file  . Years of education: Not on file  . Highest education level: Not on file  Occupational History  . Not on file  Social Needs  . Financial resource strain: Not on file  . Food insecurity:    Worry: Not on file    Inability: Not on file  . Transportation needs:    Medical: Not on file    Non-medical: Not on file  Tobacco Use  . Smoking status: Never Smoker  . Smokeless tobacco: Never Used  Substance and Sexual Activity  . Alcohol use: No  . Drug use: No  . Sexual activity: Not on file  Lifestyle  .  Physical activity:    Days per week: Not on file    Minutes per session: Not on file  . Stress: Not on file  Relationships  . Social connections:    Talks on phone: Not on file    Gets together: Not on file    Attends religious service: Not on file    Active member of club or organization: Not on file    Attends meetings of clubs or organizations: Not on file    Relationship status: Not on file  . Intimate partner violence:    Fear of current or ex partner: Not on file    Emotionally abused: Not on file    Physically abused: Not on file    Forced  sexual activity: Not on file  Other Topics Concern  . Not on file  Social History Narrative  . Not on file    REVIEW OF SYSTEMS: Constitutional: No fevers, chills, or sweats, no generalized fatigue, change in appetite Eyes: No visual changes, double vision, eye pain Ear, nose and throat: No hearing loss, ear pain, nasal congestion, sore throat Cardiovascular: No chest pain, palpitations Respiratory:  No shortness of breath at rest or with exertion, wheezes GastrointestinaI: No nausea, vomiting, diarrhea, abdominal pain, fecal incontinence Genitourinary:  No dysuria, urinary retention or frequency Musculoskeletal:  No neck pain, back pain Integumentary: No rash, pruritus, skin lesions Neurological: as above Psychiatric: No depression, insomnia, anxiety Endocrine: No palpitations, fatigue, diaphoresis, mood swings, change in appetite, change in weight, increased thirst Hematologic/Lymphatic:  No purpura, petechiae. Allergic/Immunologic: no itchy/runny eyes, nasal congestion, recent allergic reactions, rashes  PHYSICAL EXAM: Vitals:   03/04/18 1136  BP: 104/68  Pulse: 69  SpO2: 96%   General: No acute distress.  Patient appears well-groomed.   Head:  Normocephalic/atraumatic.  Tenderness to palpation of right TMJ Eyes:  Fundi examined but not visualized Neck: supple, bilateral suboccipital and paraspinal tenderness, full range  of motion Heart:  Regular rate and rhythm Lungs:  Clear to auscultation bilaterally Back: No paraspinal tenderness Neurological Exam: Mental status: alert and oriented to person, place, and time, recent and remote memory intact, fund of knowledge intact, attention and concentration intact, speech fluent and not dysarthric, language intact. Cranial nerves: maybe very mild flattening of right nasolabial fold, otherwise CN II-XII intact.  Bulk & Tone: normal, no fasciculations.  Motor:  5/5 throughout.  Sensation: light touch sensation intact.  Deep Tendon Reflexes:  2+ throughout.  Finger to nose testing:  Without dysmetria.  Gait:  Normal station and stride. Romberg negative.  IMPRESSION: 1.  Ramsay Hunt syndrome with post-herpetic neuralgia. She had associated vesicular rash and positive cultures.  Her neurologic exam is non-focal and does not suggest another intracranial etiology.  She endorses some pain in the left ear now but without any objective focal abnormalities. 2.  Cervicalgia with symptoms consistent with occipital neuralgia 3.  Right sided jaw pain, may be tension due to facial discomfort from neuralgia but also consider TMJ dysfunction  PLAN: 1.  As Gralise seems more effective, will continue and titrate up to 1800mg  daily 2.  Check MRI of cervical spine to evaluate for cervicogenic cause of occipital neuralgia 3.  Recommend following up with her dentist to evaluate for TMJ dysfunction 4.  Monitor back neuralgia 5.  Follow up in 4 months.   Metta Clines, DO  CC: Dr. Justin Mend

## 2018-03-04 NOTE — Patient Instructions (Addendum)
1.  Continue titrating up the dose of the Gralise.  We will see if it will be covered and how much you would need to pay out of pocket.  Otherwise, we can return to the regular gabapentin.  2.  We will order MRI of cervical spine for neck pain and occipital neuralgia. We have sent a referral to [facility] for your MRI and they will call you directly to schedule your appt. They are located at [place]. If you need to contact them directly please call [phone].  3.  Follow up with your dentist to see if you may have TMJ dysfunctilon in the right jaw. 4.  Follow up with me in 4 months.

## 2018-03-20 ENCOUNTER — Telehealth: Payer: Self-pay

## 2018-03-20 NOTE — Telephone Encounter (Signed)
Rcvd VM from [place] at [facility]. She is requesting clinicals for Gralise PA, faxed to [phone]

## 2018-03-21 ENCOUNTER — Other Ambulatory Visit: Payer: Self-pay | Admitting: Neurology

## 2018-03-24 DIAGNOSIS — L255 Unspecified contact dermatitis due to plants, except food: Secondary | ICD-10-CM | POA: Diagnosis not present

## 2018-03-25 ENCOUNTER — Ambulatory Visit
Admission: RE | Admit: 2018-03-25 | Discharge: 2018-03-25 | Disposition: A | Discharge status: Home / Self Care | Payer: BLUE CROSS/BLUE SHIELD | Source: Ambulatory Visit | Attending: Neurology | Admitting: Neurology

## 2018-03-25 ENCOUNTER — Telehealth: Payer: Self-pay

## 2018-03-25 DIAGNOSIS — M542 Cervicalgia: Secondary | ICD-10-CM

## 2018-03-25 DIAGNOSIS — M4802 Spinal stenosis, cervical region: Secondary | ICD-10-CM | POA: Diagnosis not present

## 2018-03-25 NOTE — Telephone Encounter (Signed)
Loa Socks with [place] [facility] called to clarify directions for Gralise. Advised him Pt is titrating up to 1800 mg QD

## 2018-03-27 ENCOUNTER — Telehealth: Payer: Self-pay

## 2018-03-27 DIAGNOSIS — M542 Cervicalgia: Secondary | ICD-10-CM

## 2018-03-27 NOTE — Telephone Encounter (Signed)
Called Pt, LMOVM advising her of C spine MRI results, and to call if she would like to proceed with P/T

## 2018-03-27 NOTE — Telephone Encounter (Signed)
-----   Message from Cameron Sprang, MD sent at 03/26/2018 11:16 AM EDT ----- Pls let her know the cervical MRI showed a disc bulge with spinal stenosis, which can cause neck pain. Does she want to do Physical Therapy for the neck pain?

## 2018-03-29 NOTE — Telephone Encounter (Signed)
Rcvd VM from Pt, she has questions about MRI result and P/T.  Called and spoke with Pt. She wanted to know if she should see a Psychologist, sport and exercise.I advised her the recommendation at this time is for P/T, and if that was not helpful, other options could be explored. Pt was agreeable. I asked if she had a preference for P/T, she did not.

## 2018-03-29 NOTE — Addendum Note (Signed)
Addended by: Clois Comber on: 03/29/2018 12:08 PM   Modules accepted: Orders

## 2018-04-02 ENCOUNTER — Other Ambulatory Visit: Payer: Self-pay

## 2018-04-02 ENCOUNTER — Ambulatory Visit: Payer: BLUE CROSS/BLUE SHIELD | Attending: Neurology | Admitting: Physical Therapy

## 2018-04-02 ENCOUNTER — Encounter: Payer: Self-pay | Admitting: Physical Therapy

## 2018-04-02 DIAGNOSIS — M6281 Muscle weakness (generalized): Secondary | ICD-10-CM | POA: Insufficient documentation

## 2018-04-02 DIAGNOSIS — R293 Abnormal posture: Secondary | ICD-10-CM | POA: Insufficient documentation

## 2018-04-02 DIAGNOSIS — M542 Cervicalgia: Secondary | ICD-10-CM | POA: Insufficient documentation

## 2018-04-02 NOTE — Patient Instructions (Signed)
    Posture Tips DO: - stand tall and erect - keep chin tucked in - keep head and shoulders in alignment - check posture regularly in mirror or large window - pull head back against headrest in car seat;  Change your position often.  Sit with lumbar support. DON'T: - slouch or slump while watching TV or reading - sit, stand or lie in one position  for too long;  Sitting is especially hard on the spine so if you sit at a desk/use the computer, then stand up often!   Copyright  VHI. All rights reserved.  Posture - Standing   Good posture is important. Avoid slouching and forward head thrust. Maintain curve in low back and align ears over shoul- ders, hips over ankles.  Pull your belly button in toward your back bone.   Copyright  VHI. All rights reserved.  Posture - Sitting   Sit upright, head facing forward. Try using a roll to support lower back. Keep shoulders relaxed, and avoid rounded back. Keep hips level with knees. Avoid crossing legs for long periods.   Copyright  VHI. All rights reserved.      Extensors, Supine   Lie supine, head on small, rolled towel. Gently tuck chin and bring toward chest. Hold 3___ seconds. Repeat _5-6__ times per session. Do _4-5__ sessions per day.  Copyright  VHI. All rights reserved.  Flexibility: Neck Retraction   Pull head straight back, keeping eyes and jaw level. Repeat __5-6__ times per set. Do __1__ sets per session. Do _4-5___ sessions per day.  http://orth.exer.us/344   Copyright  VHI. All rights reserved.     Ruben Im PT [facility] [address] [place], [place] [address] Phone # [phone] Fax [phone]

## 2018-04-02 NOTE — Therapy (Signed)
[facility] [address] [place], [place], [address] Phone: [phone]   Fax:  [phone]  Physical Therapy Evaluation  Patient Details  Name: Kristen Chan MRN: [id number] Date of Birth: 23-Apr-1971 Referring Provider: Dr. Tomi Likens   Encounter Date: 04/02/2018  PT End of Session - 04/02/18 0848    Visit Number  1    Date for PT Re-Evaluation  05/28/18    Authorization Type  BCBS 30 visit limit    PT Start Time  0801    PT Stop Time  0846    PT Time Calculation (min)  45 min    Activity Tolerance  Patient tolerated treatment well       Past Medical History:  Diagnosis Date  . Anemia    history of anemia  . Complication of anesthesia    patient requests lightweight anesthesia  . GERD (gastroesophageal reflux disease)   . Hypothyroidism     Past Surgical History:  Procedure Laterality Date  . ABDOMINAL HYSTERECTOMY    . APPENDECTOMY    . CHOLECYSTECTOMY    . CYSTOSCOPY N/A 02/24/2015   Procedure: CYSTOSCOPY;  Surgeon: Bobbye Charleston, MD;  Location: [facility];  Service: Gynecology;  Laterality: N/A;  . KNEE ARTHROSCOPY    . MYOMECTOMY    . ROBOTIC ASSISTED BILATERAL SALPINGO OOPHERECTOMY N/A 02/24/2015   Procedure: ROBOTIC ASSISTED BILATERAL SALPINGO OOPHORECTOMY;  Surgeon: Bobbye Charleston, MD;  Location: [facility];  Service: Gynecology;  Laterality: N/A;  . thumb joint stabiliztion    . WISDOM TOOTH EXTRACTION      There were no vitals filed for this visit.   Subjective Assessment - 04/02/18 0805    Subjective  1 year ago diagnosed with type of  shingles which attacked cranial nerves.  Then 6 months started having neck pain.  Daily pain bilateral neck out to both shoulders.  Also posterior head pain. Tenderness around bra line.  No UE symptoms.      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  Yes    Pain Score  7     Pain Location  Neck    Pain Orientation  Right;Left    Pain Type  Chronic pain    Pain Onset  More than a month ago    Pain Frequency  Constant    Aggravating Factors   as the day goes on; working on laptop all day; looking down, turning     Pain Relieving Factors  Advil;  heat;  support/massage         [facility] PT Assessment - 04/02/18 0001      Assessment   Medical Diagnosis  cervicalgia     Referring Provider  Dr. Tomi Likens    Onset Date/Surgical Date  -- 6 months    Hand Dominance  Left    Next MD Visit  6 months Nov    Prior Therapy  knee and thumb,  LBP      Precautions   Precautions  None      Restrictions   Weight Bearing Restrictions  No      Balance Screen   Has the patient fallen in the past 6 months  No    Has the patient had a decrease  in activity level because of a fear of falling?   No    Is the patient reluctant to leave their home because of a fear of falling?   No      Home Film/video editor residence      Prior Function   Level of Independence  Independent    Vocation  Full time employment    Vocation Requirements  90% sitting    Leisure  garden, [place], [place] a beach       Observation/Other Assessments   Focus on Therapeutic Outcomes (FOTO)   41% limitation       Posture/Postural Control   Posture/Postural Control  Postural limitations    Postural Limitations  Rounded Shoulders;Forward head      AROM   AROM Assessment Site  -- UE ROM WFLS    Cervical Flexion  65 pain    Cervical Extension  56    Cervical - Right Side Bend  40    Cervical - Left Side Bend  25    Cervical - Right Rotation  40    Cervical - Left Rotation  40      Strength   Overall Strength Comments  UE strength WFLs except periscapular muscles 4/5    Cervical Flexion  4-/5    Cervical Extension  4-/5      Palpation   Spinal mobility  decreased PA  cervical joint mobility     Palpation comment  tender points in left > right, bil cervical paraspinals, suboccipitals      Spurling's   Findings  Negative      Distraction Test   Findngs  Negative                Objective measurements completed on examination: See above findings.              PT Education - 04/02/18 0902    Education Details  posture education, supine and seated cervical retractions  (did not use Medbridge)    Person(s) Educated  Patient    Methods  Explanation;Demonstration;Handout    Comprehension  Returned demonstration;Verbalized understanding       PT Short Term Goals - 04/02/18 0922      PT SHORT TERM GOAL #1   Title  The patient will demonstrate knowledge of postural correction and basic knowledge of work station ergonomics     Time  4    Period  Weeks    Status  New    Target Date  04/30/18      PT SHORT TERM GOAL #2   Title  The patient will report a 30% improvement in neck pain with usual ADLS    Time  4    Period  Weeks    Status  New      PT SHORT TERM GOAL #3   Title  The patient will have improved cervical right sidebending to 30 degrees and extension to 60 degrees needed for driving and outdoor activities    Time  4    Period  Weeks    Status  New        PT Long Term Goals - 04/02/18 9528      PT LONG TERM GOAL #1   Title  The patient will be independent in safe self progression of HEP      Time  8    Period  Weeks    Status  New    Target Date  05/28/18      PT LONG TERM GOAL #2   Title  The patient will report a 60% improvement in neck pain with work and home activities    Time  8    Period  Weeks    Status  New      PT LONG TERM GOAL #3   Title  The patient will have improved neck and and scapular muscle strength to grossly 4/5 needed for sitting erect for longer periods of time    Time  8    Period  Weeks    Status  New      PT LONG TERM GOAL #4   Title  The patient will have full cervical ROM  with minimal to no pain needed for driving and other usual ADLS and recreational activities    Time  8    Period  Weeks    Status  New      PT LONG TERM GOAL #5   Title  FOTO functional outcome score improved from 41% limitation to 32% indicating improved function with less pain.      Time  8    Period  Weeks    Status  New             Plan - 04/02/18 0910    Clinical Impression Statement  1 year ago diagnosed with type of shingles which attacked cranial nerves.  Then, 6 months started having neck pain.  She reports constant  pain in her neck which extends out to both shoulders.  She also has posterior head pain but  no UE symptoms.  She reports her pain is worsened as the day goes on especially on work days where she sits using a laptop 90% of the time.  She has moderate head forward posture.  Her cervical ROM is limited with left sidebending and extension.   Her deep cervical flexors and extensor muscle strength is 4-/5.  Periscapular strength grossly 4/5.  She has tender points in bilateral cervical paraspinals, upper traps, and suboccipitals.   Decreased cervical joint mobility.    She would benefit from PT to address these deficits.      History and Personal Factors relevant to plan of care:  good psychosocial support;  minimal co-morbidities    Clinical Presentation  Stable    Clinical Decision Making  Low    Rehab Potential  Good    Clinical Impairments Affecting Rehab Potential  recent history of shingles affecting cranial nerves;  osteophyte C5-6 right     PT Frequency  2x / week    PT Duration  8 weeks    PT Treatment/Interventions  ADLs/Self Care Home Management;Cryotherapy;Electrical Stimulation;Traction;Ultrasound;Moist Heat;Therapeutic activities;Therapeutic exercise;Patient/family education;Neuromuscular re-education;Manual techniques;Dry needling;Taping    PT Next Visit Plan  assess response to cervical seated and supine retractions and postural correction in sitting;    Progress HEP;   patient will research and consider DN;  manual therapy;  MH/ES as needed     PT Home Exercise Plan  did not use Medbridge for HEP    Consulted and Agree with Plan of Care  Patient       Patient will benefit from skilled therapeutic intervention in order to improve the following deficits and impairments:  Pain, Increased fascial restricitons, Postural dysfunction, Decreased range of motion, Decreased strength, Hypomobility  Visit Diagnosis: Cervicalgia - Plan: PT plan of care cert/re-cert  Abnormal posture - Plan: PT plan of care cert/re-cert  Muscle weakness (generalized) - Plan:  PT plan of care cert/re-cert     Problem List Patient Active Problem List   Diagnosis Date Noted  . Ramsay Hunt syndrome (geniculate herpes zoster) 07/16/2017   Kristen Chan, PT 04/02/18 5:36 PM Phone: [phone] Fax: [phone]  Kristen Chan 04/02/2018, 5:36 PM  [facility] [address] [place], [place], [address] Phone: [phone]   Fax:  [phone]  Name: Kristen Chan MRN: [id number] Date of Birth: [birth year]

## 2018-04-08 ENCOUNTER — Encounter: Payer: Self-pay | Admitting: Physical Therapy

## 2018-04-08 ENCOUNTER — Ambulatory Visit: Payer: BLUE CROSS/BLUE SHIELD | Admitting: Physical Therapy

## 2018-04-08 DIAGNOSIS — M6281 Muscle weakness (generalized): Secondary | ICD-10-CM | POA: Diagnosis not present

## 2018-04-08 DIAGNOSIS — M542 Cervicalgia: Secondary | ICD-10-CM

## 2018-04-08 DIAGNOSIS — R293 Abnormal posture: Secondary | ICD-10-CM | POA: Diagnosis not present

## 2018-04-08 NOTE — Therapy (Addendum)
[facility] [address], [place], [place], [address] Phone: [phone]   Fax:  [phone]  Physical Therapy Treatment  Patient Details  Name: Kristen Chan MRN: [id number] Date of Birth: December 07, 1970 Referring Provider: Dr. Tomi Likens   Encounter Date: 04/08/2018  PT End of Session - 04/08/18 1633    Visit Number  2    Date for PT Re-Evaluation  05/28/18    Authorization Type  BCBS 30 visit limit    PT Start Time  1445    PT Stop Time  1530    PT Time Calculation (min)  45 min    Activity Tolerance  Patient tolerated treatment well    Behavior During Therapy  [facility] for tasks assessed/performed       Past Medical History:  Diagnosis Date  . Anemia    history of anemia  . Complication of anesthesia    patient requests lightweight anesthesia  . GERD (gastroesophageal reflux disease)   . Hypothyroidism     Past Surgical History:  Procedure Laterality Date  . ABDOMINAL HYSTERECTOMY    . APPENDECTOMY    . CHOLECYSTECTOMY    . CYSTOSCOPY N/A 02/24/2015   Procedure: CYSTOSCOPY;  Surgeon: Bobbye Charleston, MD;  Location: [facility];  Service: Gynecology;  Laterality: N/A;  . KNEE ARTHROSCOPY    . MYOMECTOMY    . ROBOTIC ASSISTED BILATERAL SALPINGO OOPHERECTOMY N/A 02/24/2015   Procedure: ROBOTIC ASSISTED BILATERAL SALPINGO OOPHORECTOMY;  Surgeon: Bobbye Charleston, MD;  Location: [facility];  Service: Gynecology;  Laterality: N/A;  . thumb joint stabiliztion    . WISDOM TOOTH EXTRACTION      There were no vitals filed for this visit.  Subjective Assessment - 04/08/18 1454    Subjective  My neck is bothering me bad.  The exercises have helped some.     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  Yes    Pain Score  7     Pain Location   Neck    Pain Orientation  Right;Left    Pain Descriptors / Indicators  Tightness;Burning    Pain Type  Chronic pain    Pain Onset  More than a month ago    Pain Frequency  Constant    Aggravating Factors   as the day goes on ; working on laptop all day; looking down; turning    Pain Relieving Factors  Advil; heat; support/massage    Multiple Pain Sites  No                       [facility] Adult PT Treatment/Exercise - 04/09/18 0001      Manual Therapy   Manual Therapy  --    Kinesiotex  --       Trigger Point Dry Needling - 04/09/18 0928    Consent Given?  Yes    Education Handout Provided  Yes    Muscles Treated Upper Body  Upper trapezius;Suboccipitals muscle group    Upper Trapezius Response  Twitch reponse elicited;Palpable increased muscle length    SubOccipitals Response  Twitch response elicited;Palpable increased muscle length           PT Education - 04/08/18 1633    Education  Details  information on dry needling    Person(s) Educated  Patient    Methods  Explanation;Handout    Comprehension  Verbalized understanding       PT Short Term Goals - 04/02/18 0922      PT SHORT TERM GOAL #1   Title  The patient will demonstrate knowledge of postural correction and basic knowledge of work station ergonomics     Time  4    Period  Weeks    Status  New    Target Date  04/30/18      PT SHORT TERM GOAL #2   Title  The patient will report a 30% improvement in neck pain with usual ADLS    Time  4    Period  Weeks    Status  New      PT SHORT TERM GOAL #3   Title  The patient will have improved cervical right sidebending to 30 degrees and extension to 60 degrees needed for driving and outdoor activities    Time  4    Period  Weeks    Status  New        PT Long Term Goals - 04/02/18 2130      PT LONG TERM GOAL #1   Title  The patient will be independent in safe self progression of HEP      Time  8    Period  Weeks    Status  New    Target  Date  05/28/18      PT LONG TERM GOAL #2   Title  The patient will report a 60% improvement in neck pain with work and home activities    Time  8    Period  Weeks    Status  New      PT LONG TERM GOAL #3   Title  The patient will have improved neck and and scapular muscle strength to grossly 4/5 needed for sitting erect for longer periods of time    Time  8    Period  Weeks    Status  New      PT LONG TERM GOAL #4   Title  The patient will have full cervical ROM with minimal to no pain needed for driving and other usual ADLS and recreational activities    Time  8    Period  Weeks    Status  New      PT LONG TERM GOAL #5   Title  FOTO functional outcome score improved from 41% limitation to 32% indicating improved function with less pain.      Time  8    Period  Weeks    Status  New            Plan - 04/08/18 1634    Clinical Impression Statement  Patient is able to perform HEP correctly from initial evaluation.  Patient had several trigger points in the upper trap and suboccipitals.  Patient neck pain felt better after therapy.  Patient has not met goals yet due to just starting therapy.  Patient will benefit from skilled therapy to address her deficits.     Rehab Potential  Good    Clinical Impairments Affecting Rehab Potential  recent history of shingles affecting cranial nerves;  osteophyte C5-6 right     PT Frequency  2x / week    PT Duration  8 weeks    PT Treatment/Interventions  ADLs/Self Care Home Management;Cryotherapy;Electrical Stimulation;Traction;Ultrasound;Moist Heat;Therapeutic activities;Therapeutic  exercise;Patient/family education;Neuromuscular re-education;Manual techniques;Dry needling;Taping    PT Next Visit Plan     Progress HEP;   see reaction to dry needling and kinesio tape;   manual therapy;  MH/ES as needed ;     PT Home Exercise Plan  did not use Medbridge for HEP    Recommended Other Services  MD signed initial eval    Consulted and Agree with  Plan of Care  Patient       Patient will benefit from skilled therapeutic intervention in order to improve the following deficits and impairments:  Pain, Increased fascial restricitons, Postural dysfunction, Decreased range of motion, Decreased strength, Hypomobility  Visit Diagnosis: Cervicalgia  Abnormal posture  Muscle weakness (generalized)     Problem List Patient Active Problem List   Diagnosis Date Noted  . Ramsay Hunt syndrome (geniculate herpes zoster) 07/16/2017    Earlie Counts, PT 04/09/18 9:31 AM   [facility] [address] [place], [place], [address] Phone: [phone]   Fax:  [phone]  Name: Kristen Chan MRN: [id number] Date of Birth: [birth year]

## 2018-04-08 NOTE — Patient Instructions (Signed)

## 2018-04-10 ENCOUNTER — Ambulatory Visit: Payer: BLUE CROSS/BLUE SHIELD | Admitting: Physical Therapy

## 2018-04-10 ENCOUNTER — Encounter: Payer: Self-pay | Admitting: Physical Therapy

## 2018-04-10 DIAGNOSIS — M542 Cervicalgia: Secondary | ICD-10-CM | POA: Diagnosis not present

## 2018-04-10 DIAGNOSIS — M6281 Muscle weakness (generalized): Secondary | ICD-10-CM | POA: Diagnosis not present

## 2018-04-10 DIAGNOSIS — R293 Abnormal posture: Secondary | ICD-10-CM | POA: Diagnosis not present

## 2018-04-10 NOTE — Patient Instructions (Signed)
Access Code: [email or web address]  URL: [email or web address]  Date: 04/10/2018  Prepared by: Earlie Counts   Exercises  Seated Cervical Sidebending Stretch - 2 reps - 1 sets - 15 sec hold - 1x daily - 7x weekly  Seated Levator Scapulae Stretch - 2 reps - 1 sets - 15 sec hold - 1x daily - 7x weekly  Chest and Bicep Stretch - Arms Behind Back - 2 reps - 1 sets - 15 hold - 1x daily - 7x weekly  Seated Thoracic Lumbar Extension with Pectoralis Stretch - 2 reps - 1 sets - 15 hold - 1x daily - 7x weekly  Supine Scapular Retraction - 5 reps - 1 sets - 10 hold - 1x daily - 7x weekly  Supine Alternating Shoulder Flexion - 10 reps - 1 sets - 1x daily - 7x weekly  Supine Shoulder Horizontal Abduction with Dumbbells - 10 reps - 1 sets - 1x daily - 7x weekly  [facility] [address] [place], [place] [address] Phone # [phone] Fax [phone]

## 2018-04-10 NOTE — Therapy (Signed)
[facility] [address], [place], [place], [address] Phone: [phone]   Fax:  [phone]  Physical Therapy Treatment  Patient Details  Name: Kristen Chan MRN: [id number] Date of Birth: 05/18/1971 Referring Provider: Dr. Tomi Likens   Encounter Date: 04/10/2018  PT End of Session - 04/10/18 1009    Visit Number  3    Date for PT Re-Evaluation  05/28/18    Authorization Type  BCBS 30 visit limit    Authorization - Visit Number  3    Authorization - Number of Visits  30    PT Start Time  0800    PT Stop Time  0845    PT Time Calculation (min)  45 min    Activity Tolerance  Patient tolerated treatment well    Behavior During Therapy  [facility] for tasks assessed/performed       Past Medical History:  Diagnosis Date  . Anemia    history of anemia  . Complication of anesthesia    patient requests lightweight anesthesia  . GERD (gastroesophageal reflux disease)   . Hypothyroidism     Past Surgical History:  Procedure Laterality Date  . ABDOMINAL HYSTERECTOMY    . APPENDECTOMY    . CHOLECYSTECTOMY    . CYSTOSCOPY N/A 02/24/2015   Procedure: CYSTOSCOPY;  Surgeon: Bobbye Charleston, MD;  Location: [facility];  Service: Gynecology;  Laterality: N/A;  . KNEE ARTHROSCOPY    . MYOMECTOMY    . ROBOTIC ASSISTED BILATERAL SALPINGO OOPHERECTOMY N/A 02/24/2015   Procedure: ROBOTIC ASSISTED BILATERAL SALPINGO OOPHORECTOMY;  Surgeon: Bobbye Charleston, MD;  Location: [facility];  Service: Gynecology;  Laterality: N/A;  . thumb joint stabiliztion    . WISDOM TOOTH EXTRACTION      There were no vitals filed for this visit.  Subjective Assessment - 04/10/18 0802    Subjective  I felt better after last treatment.     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  Yes    Pain Score  3     Pain Location  Neck    Pain Orientation  Left    Pain Descriptors / Indicators  Tightness;Burning    Pain Onset  More than a month ago    Pain Frequency  Constant    Aggravating Factors   as the day goes on ; working on laptop all day; looking down; turning    Pain Relieving Factors  advil; heat; support/massage    Multiple Pain Sites  No                       [facility] Adult PT Treatment/Exercise - 04/10/18 0001      Manual Therapy   Manual Therapy  Soft tissue mobilization;Joint mobilization;Taping    Joint Mobilization  P-A mobilization to C7-T3 grade 3    Soft tissue mobilization  bil. upper trap, subocciptials, cervical paraspinals    Kinesiotex  Inhibit Muscle      Kinesiotix   Inhibit Muscle   bilateral upper trap and cervical paraspinals       Trigger Point Dry Needling - 04/10/18 8003    Consent Given?  Yes    Muscles Treated Upper Body  -- C6-T3 multifidi  Muscles Treated Lower Body  -- twitch response, elongation of tissue           PT Education - 04/10/18 0816    Education Details  Access Code: [email or web address]     Person(s) Educated  Patient    Methods  Explanation;Handout;Demonstration    Comprehension  Verbalized understanding;Returned demonstration       PT Short Term Goals - 04/10/18 1012      PT SHORT TERM GOAL #1   Title  The patient will demonstrate knowledge of postural correction and basic knowledge of work Scientist, water quality     Time  4    Period  Weeks    Status  Achieved      PT SHORT TERM GOAL #2   Title  The patient will report a 30% improvement in neck pain with usual ADLS    Time  4    Period  Weeks    Status  On-going        PT Long Term Goals - 04/02/18 0925      PT LONG TERM GOAL #1   Title  The patient will be independent in safe self progression of HEP      Time  8    Period  Weeks    Status  New    Target Date  05/28/18      PT LONG TERM GOAL #2   Title  The patient will  report a 60% improvement in neck pain with work and home activities    Time  8    Period  Weeks    Status  New      PT LONG TERM GOAL #3   Title  The patient will have improved neck and and scapular muscle strength to grossly 4/5 needed for sitting erect for longer periods of time    Time  8    Period  Weeks    Status  New      PT LONG TERM GOAL #4   Title  The patient will have full cervical ROM with minimal to no pain needed for driving and other usual ADLS and recreational activities    Time  8    Period  Weeks    Status  New      PT LONG TERM GOAL #5   Title  FOTO functional outcome score improved from 41% limitation to 32% indicating improved function with less pain.      Time  8    Period  Weeks    Status  New            Plan - 04/10/18 0800    Clinical Impression Statement  Patient is responding to dry needling . Patient reports she is feeling better.  Patient is learning how to exercise the cervical and upper thoracic area in supine.  Patient is not having increased pain after therapy.  Patient understands how to correct her posture and decrease strain on cervical. Patient will benefit from skilled therapy to increase mobility and strength  of cervical thoracic area.     Rehab Potential  Good    Clinical Impairments Affecting Rehab Potential  recent history of shingles affecting cranial nerves;  osteophyte C5-6 right     PT Frequency  2x / week    PT Duration  8 weeks    PT Treatment/Interventions  ADLs/Self Care Home Management;Cryotherapy;Electrical Stimulation;Traction;Ultrasound;Moist Heat;Therapeutic activities;Therapeutic exercise;Patient/family education;Neuromuscular re-education;Manual techniques;Dry needling;Taping    PT Next Visit Plan    manual therapy;  MH/ES as needed ; Dry needling to suboccipitals and upper trap    PT Home Exercise Plan  Access Code: [email or web address]     Consulted and Agree with Plan of Care  Patient       Patient will benefit from skilled  therapeutic intervention in order to improve the following deficits and impairments:  Pain, Increased fascial restricitons, Postural dysfunction, Decreased range of motion, Decreased strength, Hypomobility  Visit Diagnosis: Cervicalgia  Abnormal posture  Muscle weakness (generalized)     Problem List Patient Active Problem List   Diagnosis Date Noted  . Ramsay Hunt syndrome (geniculate herpes zoster) 07/16/2017    Earlie Counts, PT 04/10/18 10:14 AM   [facility] [address] [place], [place], [address] Phone: [phone]   Fax:  [phone]  Name: Kristen Chan MRN: [id number] Date of Birth: 12/31/1970

## 2018-04-16 ENCOUNTER — Ambulatory Visit: Payer: BLUE CROSS/BLUE SHIELD | Admitting: Physical Therapy

## 2018-04-16 ENCOUNTER — Encounter: Payer: Self-pay | Admitting: Physical Therapy

## 2018-04-16 DIAGNOSIS — M6281 Muscle weakness (generalized): Secondary | ICD-10-CM

## 2018-04-16 DIAGNOSIS — R293 Abnormal posture: Secondary | ICD-10-CM | POA: Diagnosis not present

## 2018-04-16 DIAGNOSIS — M542 Cervicalgia: Secondary | ICD-10-CM

## 2018-04-16 NOTE — Therapy (Signed)
[facility] [address] [place], [place], [address] Phone: [phone]   Fax:  [phone]  Physical Therapy Treatment  Patient Details  Name: Kristen Chan MRN: [id number] Date of Birth: Feb 24, 1971 Referring Provider: Dr. Tomi Likens   Encounter Date: 04/16/2018  PT End of Session - 04/16/18 0816    Visit Number  4    Date for PT Re-Evaluation  05/28/18    Authorization Type  BCBS 30 visit limit    Authorization - Visit Number  4    Authorization - Number of Visits  30    PT Start Time  0800    PT Stop Time  0840    PT Time Calculation (min)  40 min    Activity Tolerance  Patient tolerated treatment well    Behavior During Therapy  [facility] for tasks assessed/performed       Past Medical History:  Diagnosis Date  . Anemia    history of anemia  . Complication of anesthesia    patient requests lightweight anesthesia  . GERD (gastroesophageal reflux disease)   . Hypothyroidism     Past Surgical History:  Procedure Laterality Date  . ABDOMINAL HYSTERECTOMY    . APPENDECTOMY    . CHOLECYSTECTOMY    . CYSTOSCOPY N/A 02/24/2015   Procedure: CYSTOSCOPY;  Surgeon: Bobbye Charleston, MD;  Location: [facility];  Service: Gynecology;  Laterality: N/A;  . KNEE ARTHROSCOPY    . MYOMECTOMY    . ROBOTIC ASSISTED BILATERAL SALPINGO OOPHERECTOMY N/A 02/24/2015   Procedure: ROBOTIC ASSISTED BILATERAL SALPINGO OOPHORECTOMY;  Surgeon: Bobbye Charleston, MD;  Location: [facility];  Service: Gynecology;  Laterality: N/A;  . thumb joint stabiliztion    . WISDOM TOOTH EXTRACTION      There were no vitals filed for this visit.  Subjective Assessment - 04/16/18 0802    Subjective  I was sore and tender for a day or two.  After soreness went away and I was not as tight. The tape irritated my skin.     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  Yes    Pain Score  2     Pain Location  Neck    Pain Orientation  Left    Pain Descriptors / Indicators  Burning;Tightness    Pain Type  Chronic pain    Pain Onset  More than a month ago    Pain Frequency  Constant    Aggravating Factors   as the day goes on; working on laptop all day; looking down; turning    Pain Relieving Factors  advil; heat; support/massage    Multiple Pain Sites  No         [facility] PT Assessment - 04/16/18 0001      AROM   Cervical Extension  70    Cervical - Right Side Bend  55    Cervical - Left Side Bend  60                   [facility] Adult PT Treatment/Exercise - 04/16/18 0001      Neck Exercises: Supine   Neck Retraction  5 reps;5 secs laying on foam roll    Shoulder Flexion  Right;Left;10 reps with  neck retraction on foam roll    Shoulder ABduction  Both;10 reps;Other (comment) yellow band; foam roll; neck retraction    Other Supine Exercise  supine on foam roll with Y motion of arms with yellow band and neck retraction then diagonals     Other Supine Exercise  hold head off mat with chin retraction 5 sec for 5 times      Neck Exercises: Prone   Other Prone Exercise  prone on elbow with chin rretraction hold 5 sec for 5 times      Manual Therapy   Manual Therapy  Soft tissue mobilization;Joint mobilization    Joint Mobilization  P=A mobilization to T1-T5; bilateral rib mobilization in prone; supine with manual chin retraction with cervical extension    Soft tissue mobilization  bilateral upper trap and interscapular       Trigger Point Dry Needling - 04/16/18 0835    Consent Given?  Yes    Muscles Treated Upper Body  Upper trapezius left    Upper Trapezius Response  Twitch reponse elicited;Palpable increased muscle length             PT Short Term Goals - 04/16/18 3845      PT SHORT TERM GOAL #2   Title  The patient will report a 30%  improvement in neck pain with usual ADLS    Baseline  36% improvement    Time  4    Period  Weeks    Status  Achieved      PT SHORT TERM GOAL #3   Title  The patient will have improved cervical right sidebending to 30 degrees and extension to 60 degrees needed for driving and outdoor activities    Time  4    Period  Weeks    Status  Achieved        PT Long Term Goals - 04/02/18 4680      PT LONG TERM GOAL #1   Title  The patient will be independent in safe self progression of HEP      Time  8    Period  Weeks    Status  New    Target Date  05/28/18      PT LONG TERM GOAL #2   Title  The patient will report a 60% improvement in neck pain with work and home activities    Time  8    Period  Weeks    Status  New      PT LONG TERM GOAL #3   Title  The patient will have improved neck and and scapular muscle strength to grossly 4/5 needed for sitting erect for longer periods of time    Time  8    Period  Weeks    Status  New      PT LONG TERM GOAL #4   Title  The patient will have full cervical ROM with minimal to no pain needed for driving and other usual ADLS and recreational activities    Time  8    Period  Weeks    Status  New      PT LONG TERM GOAL #5   Title  FOTO functional outcome score improved from 41% limitation to 32% indicating improved function with less pain.      Time  8    Period  Weeks    Status  New            Plan - 04/16/18 3212  Clinical Impression Statement  Patient has increased cervical ROM and extension.  Patient had trigger points in left upper trap that relaxed after dry needling.  Patient has met all of her STG's.  Patient reports her improvement is 50%.  Patient will benefi tfrom skilled therapy to to increase mobility and strength of cervical thoracic area.     Rehab Potential  Good    Clinical Impairments Affecting Rehab Potential  recent history of shingles affecting cranial nerves;  osteophyte C5-6 right     PT Frequency  2x /  week    PT Duration  8 weeks    PT Treatment/Interventions  ADLs/Self Care Home Management;Cryotherapy;Electrical Stimulation;Traction;Ultrasound;Moist Heat;Therapeutic activities;Therapeutic exercise;Patient/family education;Neuromuscular re-education;Manual techniques;Dry needling;Taping    PT Next Visit Plan    manual therapy;  Dry needling to suboccipitals and paraspinals; continues to strengthen cervical thoracic scapulae area in sitting    PT Home Exercise Plan  Access Code: [email or web address]     Consulted and Agree with Plan of Care  Patient       Patient will benefit from skilled therapeutic intervention in order to improve the following deficits and impairments:  Pain, Increased fascial restricitons, Postural dysfunction, Decreased range of motion, Decreased strength, Hypomobility  Visit Diagnosis: Cervicalgia  Abnormal posture  Muscle weakness (generalized)     Problem List Patient Active Problem List   Diagnosis Date Noted  . Ramsay Hunt syndrome (geniculate herpes zoster) 07/16/2017    Earlie Counts, PT 04/16/18 8:41 AM   [facility] [address] [place], [place], [address] Phone: [phone]   Fax:  [phone]  Name: Kristen Chan MRN: [id number] Date of Birth: 1971-08-01

## 2018-04-18 ENCOUNTER — Encounter: Payer: BLUE CROSS/BLUE SHIELD | Admitting: Physical Therapy

## 2018-04-23 ENCOUNTER — Encounter: Payer: Self-pay | Admitting: Physical Therapy

## 2018-04-23 ENCOUNTER — Ambulatory Visit: Payer: BLUE CROSS/BLUE SHIELD | Admitting: Physical Therapy

## 2018-04-23 DIAGNOSIS — R293 Abnormal posture: Secondary | ICD-10-CM

## 2018-04-23 DIAGNOSIS — M6281 Muscle weakness (generalized): Secondary | ICD-10-CM | POA: Diagnosis not present

## 2018-04-23 DIAGNOSIS — M542 Cervicalgia: Secondary | ICD-10-CM | POA: Diagnosis not present

## 2018-04-23 NOTE — Therapy (Signed)
[facility] [address], [place], [place], [address] Phone: [phone]   Fax:  [phone]  Physical Therapy Treatment  Patient Details  Name: Kristen Chan MRN: [id number] Date of Birth: 09-08-1971 Referring Provider: Dr. Tomi Likens   Encounter Date: 04/23/2018  PT End of Session - 04/23/18 0817    Visit Number  5    Date for PT Re-Evaluation  05/28/18    Authorization Type  BCBS 30 visit limit    Authorization - Number of Visits  30    PT Start Time  0801    PT Stop Time  0850    PT Time Calculation (min)  49 min    Activity Tolerance  Patient tolerated treatment well       Past Medical History:  Diagnosis Date  . Anemia    history of anemia  . Complication of anesthesia    patient requests lightweight anesthesia  . GERD (gastroesophageal reflux disease)   . Hypothyroidism     Past Surgical History:  Procedure Laterality Date  . ABDOMINAL HYSTERECTOMY    . APPENDECTOMY    . CHOLECYSTECTOMY    . CYSTOSCOPY N/A 02/24/2015   Procedure: CYSTOSCOPY;  Surgeon: Bobbye Charleston, MD;  Location: [facility];  Service: Gynecology;  Laterality: N/A;  . KNEE ARTHROSCOPY    . MYOMECTOMY    . ROBOTIC ASSISTED BILATERAL SALPINGO OOPHERECTOMY N/A 02/24/2015   Procedure: ROBOTIC ASSISTED BILATERAL SALPINGO OOPHORECTOMY;  Surgeon: Bobbye Charleston, MD;  Location: [facility];  Service: Gynecology;  Laterality: N/A;  . thumb joint stabiliztion    . WISDOM TOOTH EXTRACTION      There were no vitals filed for this visit.  Subjective Assessment - 04/23/18 0803    Subjective  I like the DN, get instant results.  Sore for couple of days but result are worth it.  Starts left upper trap and then will radiate into posterior head.  I stopped my Gabapentin.      Currently in Pain?  Yes    Pain Score  3     Pain Location  Neck    Pain Orientation  Left    Pain Type  Chronic pain                       [facility] Adult PT  Treatment/Exercise - 04/23/18 0001      Neck Exercises: Supine   Other Supine Exercise  yellow band narrow grip overhead, wide grip overhead; horizontal abduction single and double, sash, external rotation single and double 10x each      Moist Heat Therapy   Number Minutes Moist Heat  12 Minutes    Moist Heat Location  Cervical      Electrical Stimulation   Electrical Stimulation Location  bil cervical     Electrical Stimulation Action  IFC    Electrical Stimulation Parameters  5 ma 12 min     Electrical Stimulation Goals  Pain      Manual Therapy   Manual therapy comments  contract relax left upper trap 3x5 sec holds;  suboccipital release 2x 1 min    Soft tissue mobilization  bil. upper trap, subocciptials, cervical paraspinals       Trigger Point Dry Needling - 04/23/18 1123    Consent Given?  Yes    Muscles Treated Upper Body  Upper trapezius bil cervical multifidi     Upper Trapezius Response  Twitch reponse elicited;Palpable increased muscle length  SubOccipitals Response  Twitch response elicited;Palpable increased muscle length           PT Education - 04/23/18 0817    Education Details  supine scapular exercises    Person(s) Educated  Patient    Methods  Explanation;Handout    Comprehension  Returned demonstration;Verbalized understanding       PT Short Term Goals - 04/16/18 0807      PT SHORT TERM GOAL #2   Title  The patient will report a 30% improvement in neck pain with usual ADLS    Baseline  00% improvement    Time  4    Period  Weeks    Status  Achieved      PT SHORT TERM GOAL #3   Title  The patient will have improved cervical right sidebending to 30 degrees and extension to 60 degrees needed for driving and outdoor activities    Time  4    Period  Weeks    Status  Achieved        PT Long Term Goals - 04/02/18 9381      PT LONG TERM GOAL #1   Title  The patient will be independent in safe self progression of HEP      Time  8    Period   Weeks    Status  New    Target Date  05/28/18      PT LONG TERM GOAL #2   Title  The patient will report a 60% improvement in neck pain with work and home activities    Time  8    Period  Weeks    Status  New      PT LONG TERM GOAL #3   Title  The patient will have improved neck and and scapular muscle strength to grossly 4/5 needed for sitting erect for longer periods of time    Time  8    Period  Weeks    Status  New      PT LONG TERM GOAL #4   Title  The patient will have full cervical ROM with minimal to no pain needed for driving and other usual ADLS and recreational activities    Time  8    Period  Weeks    Status  New      PT LONG TERM GOAL #5   Title  FOTO functional outcome score improved from 41% limitation to 32% indicating improved function with less pain.      Time  8    Period  Weeks    Status  New            Plan - 04/23/18 1124    Clinical Impression Statement  The patient reports a good improvement in pain and cervical mobility.  Soft tissue mobility much improved following manual therapy and DN.  She is very receptive to patient education on postural strengthening especially after her recent diagnosis of osteopenia.  Will progress scapular strengthening to sitting and standing next visit.      Rehab Potential  Good    Clinical Impairments Affecting Rehab Potential  recent history of shingles affecting cranial nerves;  osteophyte C5-6 right     PT Frequency  2x / week    PT Duration  8 weeks    PT Treatment/Interventions  ADLs/Self Care Home Management;Cryotherapy;Electrical Stimulation;Traction;Ultrasound;Moist Heat;Therapeutic activities;Therapeutic exercise;Patient/family education;Neuromuscular re-education;Manual techniques;Dry needling;Taping    PT Next Visit Plan    recheck cervical ROM;  give home  TENS info if she found helpful;  manual therapy;  Dry needling to suboccipitals and paraspinals; continues to strengthen cervical thoracic scapulae area  in sitting    PT Home Exercise Plan  Access Code: [email or web address]   (used smartphrase rather than Medbridge for band ex's)        Patient will benefit from skilled therapeutic intervention in order to improve the following deficits and impairments:  Pain, Increased fascial restricitons, Postural dysfunction, Decreased range of motion, Decreased strength, Hypomobility  Visit Diagnosis: Cervicalgia  Abnormal posture  Muscle weakness (generalized)     Problem List Patient Active Problem List   Diagnosis Date Noted  . Ramsay Hunt syndrome (geniculate herpes zoster) 07/16/2017   Ruben Im, PT 04/23/18 11:30 AM Phone: [phone] Fax: [phone]  Alvera Singh 04/23/2018, 11:30 AM  [facility] [address], [place], [place], [address] Phone: [phone]   Fax:  [phone]  Name: Kristen Chan MRN: [id number] Date of Birth: 1970/11/01

## 2018-04-23 NOTE — Patient Instructions (Signed)
     Over Head Pull: Narrow Grip And Wide Grip n      On back, knees bent, feet flat, band across thighs, elbows straight but relaxed. Pull hands apart (start). Keeping elbows straight, bring arms up and over head, hands toward floor. Keep pull steady on band. Hold momentarily. Return slowly, keeping pull steady, back to start. Repeat _10__ times. Band color __yellow____   Side Pull: Double Arm and Single Arm   On back, knees bent, feet flat. Arms perpendicular to body, shoulder level, elbows straight but relaxed. Pull arms out to sides, elbows straight. Resistance band comes across collarbones, hands toward floor. Hold momentarily. Slowly return to starting position. Repeat __10_ times. Band color __yellow___   Sash   On back, knees bent, feet flat, left hand on left hip, right hand above left. Pull right arm DIAGONALLY (hip to shoulder) across chest. Bring right arm along head toward floor. Hold momentarily. Slowly return to starting position. Repeat __10_ times. Do with left arm. Band color __yellow____   Shoulder Rotation: Double Arm and Single   On back, knees bent, feet flat, elbows tucked at sides, bent 90, hands palms up. Pull hands apart and down toward floor, keeping elbows near sides. Hold momentarily. Slowly return to starting position. Repeat __10_ times. Band color __yellow____     [facility] [address], [place], [place] [address] Phone # [phone] Fax [phone]

## 2018-04-24 DIAGNOSIS — Z712 Person consulting for explanation of examination or test findings: Secondary | ICD-10-CM | POA: Diagnosis not present

## 2018-04-24 DIAGNOSIS — Z23 Encounter for immunization: Secondary | ICD-10-CM | POA: Diagnosis not present

## 2018-04-25 ENCOUNTER — Encounter: Payer: Self-pay | Admitting: Physical Therapy

## 2018-04-25 ENCOUNTER — Ambulatory Visit: Payer: BLUE CROSS/BLUE SHIELD | Admitting: Physical Therapy

## 2018-04-25 DIAGNOSIS — M6281 Muscle weakness (generalized): Secondary | ICD-10-CM | POA: Diagnosis not present

## 2018-04-25 DIAGNOSIS — R293 Abnormal posture: Secondary | ICD-10-CM

## 2018-04-25 DIAGNOSIS — M542 Cervicalgia: Secondary | ICD-10-CM

## 2018-04-25 NOTE — Therapy (Signed)
[facility] [address], [place], [place], [address] Phone: [phone]   Fax:  [phone]  Physical Therapy Treatment  Patient Details  Name: Kristen Chan MRN: [id number] Date of Birth: 1971-05-27 Referring Provider: Dr. Tomi Likens   Encounter Date: 04/25/2018  PT End of Session - 04/25/18 1932    Visit Number  6    Date for PT Re-Evaluation  05/28/18    Authorization Type  BCBS 30 visit limit    Authorization - Number of Visits  30    PT Start Time  0802    PT Stop Time  0850    PT Time Calculation (min)  48 min    Activity Tolerance  Patient tolerated treatment well       Past Medical History:  Diagnosis Date  . Anemia    history of anemia  . Complication of anesthesia    patient requests lightweight anesthesia  . GERD (gastroesophageal reflux disease)   . Hypothyroidism     Past Surgical History:  Procedure Laterality Date  . ABDOMINAL HYSTERECTOMY    . APPENDECTOMY    . CHOLECYSTECTOMY    . CYSTOSCOPY N/A 02/24/2015   Procedure: CYSTOSCOPY;  Surgeon: Bobbye Charleston, MD;  Location: [facility];  Service: Gynecology;  Laterality: N/A;  . KNEE ARTHROSCOPY    . MYOMECTOMY    . ROBOTIC ASSISTED BILATERAL SALPINGO OOPHERECTOMY N/A 02/24/2015   Procedure: ROBOTIC ASSISTED BILATERAL SALPINGO OOPHORECTOMY;  Surgeon: Bobbye Charleston, MD;  Location: [facility];  Service: Gynecology;  Laterality: N/A;  . thumb joint stabiliztion    . WISDOM TOOTH EXTRACTION      There were no vitals filed for this visit.  Subjective Assessment - 04/25/18 0805    Subjective  Really sore following dry needling but not the usual pain.  Patient pleased with overall response to DN.      Currently in Pain?  Yes    Pain Score  3     Pain Location  Neck    Pain Orientation  Left    Pain Type  Chronic pain         [facility] PT Assessment - 04/25/18 0001      AROM   Cervical Flexion  73    Cervical Extension  70    Cervical - Right  Side Bend  60    Cervical - Left Side Bend  53    Cervical - Right Rotation  55    Cervical - Left Rotation  58                   [facility] Adult PT Treatment/Exercise - 04/25/18 0001      Neck Exercises: Theraband   Shoulder Extension  15 reps;Limitations over the top of the door    Shoulder Extension Limitations  yellow band    Rows  15 reps;Limitations around doorknob    Rows Limitations  yellow band    Other Theraband Exercises  quadruped with yellow and around back of head with shoulder protraction and head lift to neutral 10x      Neck Exercises: Seated   Other Seated Exercise  yellow band horizontal abduction with band wrapped around wrists 10x      Neck Exercises: Supine   Other Supine Exercise  head only on foam roll: nods, circles, rotations 10x each       Neck Exercises: Sidelying   Other Sidelying Exercise  open books with knee propped on foam roll 10x  right and left       Moist Heat Therapy   Number Minutes Moist Heat  12 Minutes    Moist Heat Location  Cervical      Electrical Stimulation   Electrical Stimulation Location  bil cervical     Electrical Stimulation Action  IFC    Electrical Stimulation Parameters  7 ma 12 min     Electrical Stimulation Goals  Pain             PT Education - 04/25/18 0842    Education Details   Access Code: [email or web address] band rows, extensions in standing;  seated band horizontal abduction;  quadruped head lift to neutral with band resist;   home TENS unit info    Person(s) Educated  Patient    Methods  Explanation;Demonstration;Handout    Comprehension  Returned demonstration;Verbalized understanding       PT Short Term Goals - 04/25/18 1936      PT SHORT TERM GOAL #1   Title  The patient will demonstrate knowledge of postural correction and basic knowledge of work Pharmacologist  Achieved      PT [place] #2   Title  The patient will report a 30% improvement in neck pain with usual ADLS     Status  Achieved      PT SHORT TERM GOAL #3   Title  The patient will have improved cervical right sidebending to 30 degrees and extension to 60 degrees needed for driving and outdoor activities    Status  Achieved        PT Long Term Goals - 04/02/18 0925      PT LONG TERM GOAL #1   Title  The patient will be independent in safe self progression of HEP      Time  8    Period  Weeks    Status  New    Target Date  05/28/18      PT LONG TERM GOAL #2   Title  The patient will report a 60% improvement in neck pain with work and home activities    Time  8    Period  Weeks    Status  New      PT LONG TERM GOAL #3   Title  The patient will have improved neck and and scapular muscle strength to grossly 4/5 needed for sitting erect for longer periods of time    Time  8    Period  Weeks    Status  New      PT LONG TERM GOAL #4   Title  The patient will have full cervical ROM with minimal to no pain needed for driving and other usual ADLS and recreational activities    Time  8    Period  Weeks    Status  New      PT LONG TERM GOAL #5   Title  FOTO functional outcome score improved from 41% limitation to 32% indicating improved function with less pain.      Time  8    Period  Weeks    Status  New            Plan - 04/25/18 0827    Clinical Impression Statement  The patient is progressing very well with cervical ROM in all planes.   Very receptive with postural strengthening with minimal verbal cues for ears over shoulders neutral head position.  Decreased compensatory shoulder shrug  with ex.  Good response with ES/heat and patient is considering ordering a TENS unit for home.      Rehab Potential  Good    Clinical Impairments Affecting Rehab Potential  recent history of shingles affecting cranial nerves;  osteophyte C5-6 right     PT Frequency  2x / week    PT Duration  8 weeks    PT Treatment/Interventions  ADLs/Self Care Home Management;Cryotherapy;Electrical  Stimulation;Traction;Ultrasound;Moist Heat;Therapeutic activities;Therapeutic exercise;Patient/family education;Neuromuscular re-education;Manual techniques;Dry needling;Taping    PT Next Visit Plan   manual therapy;  Dry needling to suboccipitals and paraspinals; continue to strengthen cervical thoracic scapulae area ;  ES/heat as needed    PT Home Exercise Plan  Access Code: [email or web address]   (used smartphrase rather than Medbridge for band ex's)        Patient will benefit from skilled therapeutic intervention in order to improve the following deficits and impairments:  Pain, Increased fascial restricitons, Postural dysfunction, Decreased range of motion, Decreased strength, Hypomobility  Visit Diagnosis: Cervicalgia  Abnormal posture  Muscle weakness (generalized)     Problem List Patient Active Problem List   Diagnosis Date Noted  . Ramsay Hunt syndrome (geniculate herpes zoster) 07/16/2017   Ruben Im, PT 04/25/18 7:37 PM Phone: [phone] Fax: [phone]  Alvera Singh 04/25/2018, 7:37 PM  [facility] [address] [place], [place], [address] Phone: [phone]   Fax:  [phone]  Name: HEYLI MIN MRN: [id number] Date of Birth: [birth year]

## 2018-04-25 NOTE — Patient Instructions (Signed)
   TENS UNIT  This is helpful for muscle pain and spasm.   Search and Purchase a TENS 7000 2nd edition at www.tenspros.com or www.amazon.com  (It should be less than $30)     TENS unit instructions:   Do not shower or bathe with the unit on  Turn the unit off before removing electrodes or batteries  If the electrodes lose stickiness add a drop of water to the electrodes after they are disconnected from the unit and place on plastic sheet. If you continued to have difficulty, call the TENS unit company to purchase more electrodes.  Do not apply lotion on the skin area prior to use. Make sure the skin is clean and dry as this will help prolong the life of the electrodes.  After use, always check skin for unusual red areas, rash or other skin difficulties. If there are any skin problems, does not apply electrodes to the same area.  Never remove the electrodes from the unit by pulling the wires.  Do not use the TENS unit or electrodes other than as directed.  Do not change electrode placement without consulting your therapist or physician.  Keep 2 fingers with between each electrode.   Tamsyn Owusu PT [facility] [address], [address] [place], [place] [address] Phone # [phone] Fax [phone] 

## 2018-04-30 ENCOUNTER — Encounter: Payer: Self-pay | Admitting: Physical Therapy

## 2018-04-30 ENCOUNTER — Ambulatory Visit: Payer: BLUE CROSS/BLUE SHIELD | Admitting: Physical Therapy

## 2018-04-30 DIAGNOSIS — M6281 Muscle weakness (generalized): Secondary | ICD-10-CM

## 2018-04-30 DIAGNOSIS — R293 Abnormal posture: Secondary | ICD-10-CM | POA: Diagnosis not present

## 2018-04-30 DIAGNOSIS — M542 Cervicalgia: Secondary | ICD-10-CM

## 2018-04-30 NOTE — Therapy (Signed)
[facility] [address], [place], [place], [address] Phone: [phone]   Fax:  [phone]  Physical Therapy Treatment  Patient Details  Name: Kristen Chan MRN: [id number] Date of Birth: Nov 21, 1970 Referring Provider: Dr. Tomi Likens   Encounter Date: 04/30/2018  PT End of Session - 04/30/18 0841    Visit Number  7    Date for PT Re-Evaluation  05/28/18    Authorization Type  BCBS 30 visit limit    Authorization - Visit Number  7    Authorization - Number of Visits  30    PT Start Time  0800    PT Stop Time  0900    PT Time Calculation (min)  60 min    Activity Tolerance  Patient tolerated treatment well    Behavior During Therapy  [facility] for tasks assessed/performed       Past Medical History:  Diagnosis Date  . Anemia    history of anemia  . Complication of anesthesia    patient requests lightweight anesthesia  . GERD (gastroesophageal reflux disease)   . Hypothyroidism     Past Surgical History:  Procedure Laterality Date  . ABDOMINAL HYSTERECTOMY    . APPENDECTOMY    . CHOLECYSTECTOMY    . CYSTOSCOPY N/A 02/24/2015   Procedure: CYSTOSCOPY;  Surgeon: Bobbye Charleston, MD;  Location: [facility];  Service: Gynecology;  Laterality: N/A;  . KNEE ARTHROSCOPY    . MYOMECTOMY    . ROBOTIC ASSISTED BILATERAL SALPINGO OOPHERECTOMY N/A 02/24/2015   Procedure: ROBOTIC ASSISTED BILATERAL SALPINGO OOPHORECTOMY;  Surgeon: Bobbye Charleston, MD;  Location: [facility];  Service: Gynecology;  Laterality: N/A;  . thumb joint stabiliztion    . WISDOM TOOTH EXTRACTION      There were no vitals filed for this visit.  Subjective Assessment - 04/30/18 0805    Subjective  I was feeling good till this weekend. I reached back into a closet and felt the right side of my neck pulling.  I have a sinus headache and base of skull. My work desk is set up differently to make it better.     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  Yes    Pain Score  4     Pain Location  Neck    Pain Orientation  Right;Left    Pain Descriptors / Indicators  Burning;Tightness    Pain Type  Chronic pain    Pain Onset  More than a month ago    Pain Frequency  Constant    Aggravating Factors   as the day goes on; working on laptop all day; looking down; turning    Pain Relieving Factors  advil; heat; supponr/massage    Multiple Pain Sites  No                       [facility] Adult PT Treatment/Exercise - 04/30/18 0001      Neck Exercises: Seated   Other Seated Exercise  interscapular stretch with arms crossed together,       Neck Exercises: Supine   Other Supine Exercise  head only on foam roll: nods, circles, rotations 10x each     Other Supine Exercise  lay  on foam roll shoulder horizontal abduction with yellow band, diagonals bil. ways with yellow band then y movements with  yellow band      Neck Exercises: Sidelying   Other Sidelying Exercise  open books with knee propped on foam roll 10x right and left       Modalities   Modalities  Electrical Stimulation;Moist Heat      Moist Heat Therapy   Number Minutes Moist Heat  15 Minutes    Moist Heat Location  Cervical      Electrical Stimulation   Electrical Stimulation Location  bil cervical     Electrical Stimulation Action  IFC    Electrical Stimulation Parameters  to patient tolerance, 15 min    Electrical Stimulation Goals  Pain      Manual Therapy   Manual Therapy  Joint mobilization;Soft tissue mobilization    Joint Mobilization  joint mobilization to T2-T5 for extension and rotation; rotation of C3-C4 grade 3    Soft tissue mobilization  subocciptials and cervical paraspinals       Trigger Point Dry Needling - 04/30/18 0825    Consent Given?  Yes    Muscles Treated Upper  Body  Suboccipitals muscle group    SubOccipitals Response  Twitch response elicited;Palpable increased muscle length             PT Short Term Goals - 04/25/18 1936      PT SHORT TERM GOAL #1   Title  The patient will demonstrate knowledge of postural correction and basic knowledge of work Pharmacologist  Achieved      PT [place] #2   Title  The patient will report a 30% improvement in neck pain with usual ADLS    Status  Achieved      PT SHORT TERM GOAL #3   Title  The patient will have improved cervical right sidebending to 30 degrees and extension to 60 degrees needed for driving and outdoor activities    Status  Achieved        PT Long Term Goals - 04/30/18 0844      PT LONG TERM GOAL #1   Title  The patient will be independent in safe self progression of HEP      Time  8    Period  Weeks    Status  On-going      PT LONG TERM GOAL #2   Title  The patient will report a 60% improvement in neck pain with work and home activities    Time  8    Period  Weeks    Status  On-going      PT LONG TERM GOAL #3   Title  The patient will have improved neck and and scapular muscle strength to grossly 4/5 needed for sitting erect for longer periods of time    Time  8    Period  Weeks    Status  On-going      PT LONG TERM GOAL #4   Title  The patient will have full cervical ROM with minimal to no pain needed for driving and other usual ADLS and recreational activities    Time  8    Period  Weeks    Status  On-going            Plan - 04/30/18 0841    Clinical Impression Statement  Patient had a flare-up this weekend after she has reached into a  closet. Patient has rotation in the cervical and thoracic area. Patient had a headache today.  After treatment patient reports her pain decreased.  Patient is working on her posture when at the computer.  Patient will benefit from skilled therapy to improve postural strength and reduce pain.     Rehab  Potential  Good    Clinical Impairments Affecting Rehab Potential  recent history of shingles affecting cranial nerves;  osteophyte C5-6 right     PT Frequency  2x / week    PT Duration  8 weeks    PT Treatment/Interventions  ADLs/Self Care Home Management;Cryotherapy;Electrical Stimulation;Traction;Ultrasound;Moist Heat;Therapeutic activities;Therapeutic exercise;Patient/family education;Neuromuscular re-education;Manual techniques;Dry needling;Taping    PT Next Visit Plan   manual therapy;  Dry needling to  paraspinals; continue to strengthen cervical thoracic scapulae area ;  ES/heat as needed    PT Home Exercise Plan  Access Code: [email or web address]   (used smartphrase rather than Medbridge for band ex's)     Consulted and Agree with Plan of Care  Patient       Patient will benefit from skilled therapeutic intervention in order to improve the following deficits and impairments:  Pain, Increased fascial restricitons, Postural dysfunction, Decreased range of motion, Decreased strength, Hypomobility  Visit Diagnosis: Cervicalgia  Abnormal posture  Muscle weakness (generalized)     Problem List Patient Active Problem List   Diagnosis Date Noted  . Ramsay Hunt syndrome (geniculate herpes zoster) 07/16/2017    Earlie Counts, PT 04/30/18 8:45 AM   [facility] [address] [place], [place], [address] Phone: [phone]   Fax:  [phone]  Name: Kristen Chan MRN: [id number] Date of Birth: 1970/10/29

## 2018-05-07 ENCOUNTER — Encounter: Payer: Self-pay | Admitting: Physical Therapy

## 2018-05-07 ENCOUNTER — Ambulatory Visit: Payer: BLUE CROSS/BLUE SHIELD | Attending: Neurology | Admitting: Physical Therapy

## 2018-05-07 DIAGNOSIS — M6281 Muscle weakness (generalized): Secondary | ICD-10-CM | POA: Diagnosis not present

## 2018-05-07 DIAGNOSIS — R293 Abnormal posture: Secondary | ICD-10-CM | POA: Insufficient documentation

## 2018-05-07 DIAGNOSIS — M542 Cervicalgia: Secondary | ICD-10-CM | POA: Diagnosis not present

## 2018-05-07 NOTE — Patient Instructions (Signed)
    Access Code: [email or web address]  URL: [email or web address]  Date: 05/07/2018  Prepared by: Ruben Im   Exercises  Seated Cervical Sidebending Stretch - 2 reps - 1 sets - 15 sec hold - 1x daily - 7x weekly  Seated Levator Scapulae Stretch - 2 reps - 1 sets - 15 sec hold - 1x daily - 7x weekly  Chest and Bicep Stretch - Arms Behind Back - 2 reps - 1 sets - 15 hold - 1x daily - 7x weekly  Seated Thoracic Lumbar Extension with Pectoralis Stretch - 2 reps - 1 sets - 15 hold - 1x daily - 7x weekly  Supine Scapular Retraction - 5 reps - 1 sets - 10 hold - 1x daily - 7x weekly  Supine Alternating Shoulder Flexion - 10 reps - 1 sets - 1x daily - 7x weekly  Supine Shoulder Horizontal Abduction with Dumbbells - 10 reps - 1 sets - 1x daily - 7x weekly  Sidelying Open Book Thoracic Lumbar Rotation and Extension - 10 reps - 1 sets - 1x daily - 7x weekly  Seated Shoulder Horizontal Abduction with Resistance - 10 reps - 3 sets - 1x daily - 7x weekly  Quadruped Transversus Abdominis Bracing - 10 reps - 1 sets - 1x daily - 7x weekly  Standing Shoulder Row with Anchored Resistance - 10 reps - 1 sets - 1x daily - 7x weekly  Standing Elbow Extension with Anchored Resistance - 10 reps - 1 sets - 1x daily - 7x weekly  Seated Assisted Cervical Rotation with Towel - 10 reps - 1 sets - 1x daily - 7x weekly      [facility] [address] [place], [place] [address] Phone # [phone] Fax [phone]

## 2018-05-07 NOTE — Therapy (Signed)
[facility] [address], [place], [place], [address] Phone: [phone]   Fax:  [phone]  Physical Therapy Treatment  Patient Details  Name: Kristen Chan MRN: [id number] Date of Birth: 19-Aug-1971 Referring Provider: Dr. Tomi Likens   Encounter Date: 05/07/2018  PT End of Session - 05/07/18 2110    Visit Number  8    Date for PT Re-Evaluation  05/28/18    Authorization Type  BCBS 30 visit limit    Authorization - Number of Visits  30    PT Start Time  0800    PT Stop Time  0850    PT Time Calculation (min)  50 min    Activity Tolerance  Patient tolerated treatment well       Past Medical History:  Diagnosis Date  . Anemia    history of anemia  . Complication of anesthesia    patient requests lightweight anesthesia  . GERD (gastroesophageal reflux disease)   . Hypothyroidism     Past Surgical History:  Procedure Laterality Date  . ABDOMINAL HYSTERECTOMY    . APPENDECTOMY    . CHOLECYSTECTOMY    . CYSTOSCOPY N/A 02/24/2015   Procedure: CYSTOSCOPY;  Surgeon: Bobbye Charleston, MD;  Location: [facility];  Service: Gynecology;  Laterality: N/A;  . KNEE ARTHROSCOPY    . MYOMECTOMY    . ROBOTIC ASSISTED BILATERAL SALPINGO OOPHERECTOMY N/A 02/24/2015   Procedure: ROBOTIC ASSISTED BILATERAL SALPINGO OOPHORECTOMY;  Surgeon: Bobbye Charleston, MD;  Location: [facility];  Service: Gynecology;  Laterality: N/A;  . thumb joint stabiliztion    . WISDOM TOOTH EXTRACTION      There were no vitals filed for this visit.  Subjective Assessment - 05/07/18 0802    Subjective  It's OK but had a set back this past weekend but a little better now.  Tightness left upper neck muscles.  Busy getting ready for mission trip to [place].      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  Yes    Pain Score  1     Pain Location  Neck    Pain Orientation  Left    Pain Type  Chronic pain                       [facility] Adult PT Treatment/Exercise - 05/07/18 0001      Neck Exercises: Seated   Neck Retraction  5 reps    Cervical Rotation  -- mob with towel per HEP      Neck Exercises: Supine   Neck Retraction  5 reps      Moist Heat Therapy   Number Minutes Moist Heat  12 Minutes    Moist Heat Location  Cervical      Electrical Stimulation   Electrical Stimulation Location  bil cervical     Electrical Stimulation Action  IFC    Electrical Stimulation Parameters  7 ma 12 min     Electrical Stimulation Goals  Pain      Manual Therapy   Manual therapy comments  contract relax left upper trap 3x5 sec holds;  suboccipital release 2x 1 min    Soft tissue mobilization  subocciptials and cervical paraspinals       Trigger Point Dry Needling - 05/07/18 0809    Consent Given?  Yes    Muscles Treated Upper  Body  Upper trapezius;Oblique capitus;Suboccipitals muscle group left > right cervical multifidi     Upper Trapezius Response  Twitch reponse elicited;Palpable increased muscle length    Oblique Capitus Response  Twitch response elicited;Palpable increased muscle length    SubOccipitals Response  Twitch response elicited;Palpable increased muscle length           PT Education - 05/07/18 2109    Education Details  cervical rotation self mob with towel      Person(s) Educated  Patient    Methods  Explanation;Demonstration;Handout    Comprehension  Returned demonstration;Verbalized understanding       PT Short Term Goals - 04/25/18 1936      PT SHORT TERM GOAL #1   Title  The patient will demonstrate knowledge of postural correction and basic knowledge of work Ecologist      PT [place] #2   Title  The patient will report a 30% improvement in neck pain with usual ADLS    Status  Achieved      PT SHORT TERM GOAL #3   Title  The patient will have improved cervical right sidebending to  30 degrees and extension to 60 degrees needed for driving and outdoor activities    Status  Achieved        PT Long Term Goals - 04/30/18 0844      PT LONG TERM GOAL #1   Title  The patient will be independent in safe self progression of HEP      Time  8    Period  Weeks    Status  On-going      PT LONG TERM GOAL #2   Title  The patient will report a 60% improvement in neck pain with work and home activities    Time  8    Period  Weeks    Status  On-going      PT LONG TERM GOAL #3   Title  The patient will have improved neck and and scapular muscle strength to grossly 4/5 needed for sitting erect for longer periods of time    Time  8    Period  Weeks    Status  On-going      PT LONG TERM GOAL #4   Title  The patient will have full cervical ROM with minimal to no pain needed for driving and other usual ADLS and recreational activities    Time  [place] - 05/07/18 8185    Clinical Impression Statement  The patient reports she is better following a flare up over the weekend.  She continues to have numerous tender points in bil upper traps and suboccipitals.  Good response to DN and manual therapy.  She is leaving for a mission trip to [place].  Reviewed her HEP with discussion of focus on upper cervical spine flexion of retractions and cervical flexion and the addition of cervical mob with movement using towel while she is away.      Rehab Potential  Good    Clinical Impairments Affecting Rehab Potential  recent history of shingles affecting cranial nerves;  osteophyte C5-6 right     PT Frequency  2x / week    PT Duration  8 weeks    PT Treatment/Interventions  ADLs/Self Care Home Management;Cryotherapy;Electrical Stimulation;Traction;Ultrasound;Moist Heat;Therapeutic activities;Therapeutic  exercise;Patient/family education;Neuromuscular re-education;Manual techniques;Dry needling;Taping    PT Next Visit Plan   follow up after  her trip in 2 weeks;  manual therapy;  Dry needling to  paraspinals;  may add ES with DN;  review cervical rotation with towel assist;   continue to strengthen cervical thoracic scapulae area ;  ES/heat as needed    PT Home Exercise Plan  Access Code: [email or web address]   (used smartphrase rather than Medbridge for band ex's)        Patient will benefit from skilled therapeutic intervention in order to improve the following deficits and impairments:  Pain, Increased fascial restricitons, Postural dysfunction, Decreased range of motion, Decreased strength, Hypomobility  Visit Diagnosis: Cervicalgia  Abnormal posture  Muscle weakness (generalized)     Problem List Patient Active Problem List   Diagnosis Date Noted  . Ramsay Hunt syndrome (geniculate herpes zoster) 07/16/2017   Ruben Im, PT 05/07/18 9:15 PM Phone: [phone] Fax: [phone]  Kristen Chan 05/07/2018, 9:15 PM  [facility] [address] [place], [place], [address] Phone: [phone]   Fax:  [phone]  Name: Kristen Chan MRN: [id number] Date of Birth: 02-26-1971

## 2018-05-21 ENCOUNTER — Ambulatory Visit: Payer: BLUE CROSS/BLUE SHIELD | Admitting: Physical Therapy

## 2018-05-21 ENCOUNTER — Encounter: Payer: Self-pay | Admitting: Physical Therapy

## 2018-05-21 DIAGNOSIS — M542 Cervicalgia: Secondary | ICD-10-CM | POA: Diagnosis not present

## 2018-05-21 DIAGNOSIS — R293 Abnormal posture: Secondary | ICD-10-CM | POA: Diagnosis not present

## 2018-05-21 DIAGNOSIS — M6281 Muscle weakness (generalized): Secondary | ICD-10-CM

## 2018-05-21 NOTE — Therapy (Signed)
[facility] [address], [place], [place], [address] Phone: [phone]   Fax:  [phone]  Physical Therapy Treatment  Patient Details  Name: Kristen Chan MRN: [id number] Date of Birth: 10/19/1970 Referring Provider: Dr. Tomi Likens   Encounter Date: 05/21/2018  PT End of Session - 05/21/18 0859    Visit Number  9    Date for PT Re-Evaluation  05/28/18    Authorization Type  BCBS 30 visit limit    Authorization - Number of Visits  30    PT Start Time  0758    PT Stop Time  0843    PT Time Calculation (min)  45 min    Activity Tolerance  Patient tolerated treatment well       Past Medical History:  Diagnosis Date  . Anemia    history of anemia  . Complication of anesthesia    patient requests lightweight anesthesia  . GERD (gastroesophageal reflux disease)   . Hypothyroidism     Past Surgical History:  Procedure Laterality Date  . ABDOMINAL HYSTERECTOMY    . APPENDECTOMY    . CHOLECYSTECTOMY    . CYSTOSCOPY N/A 02/24/2015   Procedure: CYSTOSCOPY;  Surgeon: Bobbye Charleston, MD;  Location: [facility];  Service: Gynecology;  Laterality: N/A;  . KNEE ARTHROSCOPY    . MYOMECTOMY    . ROBOTIC ASSISTED BILATERAL SALPINGO OOPHERECTOMY N/A 02/24/2015   Procedure: ROBOTIC ASSISTED BILATERAL SALPINGO OOPHORECTOMY;  Surgeon: Bobbye Charleston, MD;  Location: [facility];  Service: Gynecology;  Laterality: N/A;  . thumb joint stabiliztion    . WISDOM TOOTH EXTRACTION      There were no vitals filed for this visit.  Subjective Assessment - 05/21/18 0757    Subjective  My neck was good during trip to [place] when carrying bags.  Back to work yesterday and feels tight left upper trap region.      Currently in Pain?  Yes    Pain Score  2     Pain Location  Neck    Pain Orientation  Left    Pain Type  Chronic pain    Aggravating Factors   carrying travel bags                       [facility] Adult PT  Treatment/Exercise - 05/21/18 0001      Neck Exercises: Theraband   Other Theraband Exercises  review of band exs for sitting      Neck Exercises: Seated   Neck Retraction  5 reps   with towel assist   Cervical Rotation  Right;Left;5 reps   towel assist   Other Seated Exercise  cervical extension with towel assist 5x      Neck Exercises: Supine   Neck Retraction  --   towel assist     Neck Exercises: Prone   Other Prone Exercise  shoulder I, T, Y with head lift 5x each       Moist Heat Therapy   Number Minutes Moist Heat  12 Minutes    Moist Heat Location  Cervical      Electrical Stimulation   Electrical Stimulation Location  left cervical     Electrical Stimulation Action  Pre-mod    Electrical Stimulation Parameters  5 ma 12 min     Electrical Stimulation Goals  Pain      Manual Therapy   Manual therapy comments  contract relax left upper trap 3x5 sec holds;  suboccipital release 2x 1 min    Soft tissue mobilization  subocciptials and cervical paraspinals       Trigger Point Dry Needling - 05/21/18 0903    Muscles Treated Upper Body  --   left cervical multifidi   Upper Trapezius Response  Twitch reponse elicited;Palpable increased muscle length    SubOccipitals Response  Palpable increased muscle length           PT Education - 05/21/18 0839    Education Details   Access Code: [email or web address]   towel assist ex;  prone head lift arm lift    Person(s) Educated  Patient    Methods  Explanation;Demonstration;Handout    Comprehension  Returned demonstration;Verbalized understanding       PT Short Term Goals - 04/25/18 1936      PT SHORT TERM GOAL #1   Title  The patient will demonstrate knowledge of postural correction and basic knowledge of work Pharmacologist  Achieved      PT [place] #2   Title  The patient will report a 30% improvement in neck pain with usual ADLS    Status  Achieved      PT SHORT TERM GOAL #3   Title  The patient  will have improved cervical right sidebending to 30 degrees and extension to 60 degrees needed for driving and outdoor activities    Status  Achieved        PT Long Term Goals - 05/21/18 0914      PT LONG TERM GOAL #1   Title  The patient will be independent in safe self progression of HEP      Time  8    Period  Weeks    Status  On-going      PT LONG TERM GOAL #2   Title  The patient will report a 60% improvement in neck pain with work and home activities    Time  8    Period  Weeks    Status  On-going      PT LONG TERM GOAL #3   Title  The patient will have improved neck and and scapular muscle strength to grossly 4/5 needed for sitting erect for longer periods of time    Time  8    Period  Weeks    Status  On-going      PT LONG TERM GOAL #4   Title  The patient will have full cervical ROM with minimal to no pain needed for driving and other usual ADLS and recreational activities    Time  8    Period  Weeks    Status  On-going      PT LONG TERM GOAL #5   Title  FOTO functional outcome score improved from 41% limitation to 32% indicating improved function with less pain.      Time  8    Period  Weeks    Status  On-going            Plan - 05/21/18 0831    Clinical Impression Statement  The patient reports only 2 days of mild neck discomfort on the left only while on her trip.  She plans on getting back on her HEP now that her trip is over.   Good improvement with DN and most likely will not need additional DN.  Improving soft tissue length and ROM as well.  Decreased pain intensity, frequency and more centralized.  Therapist  providing verbal cues for postural alignment.      Rehab Potential  Good    Clinical Impairments Affecting Rehab Potential  recent history of shingles affecting cranial nerves;  osteophyte C5-6 right     PT Frequency  2x / week    PT Duration  8 weeks    PT Treatment/Interventions  ADLs/Self Care Home Management;Cryotherapy;Electrical  Stimulation;Traction;Ultrasound;Moist Heat;Therapeutic activities;Therapeutic exercise;Patient/family education;Neuromuscular re-education;Manual techniques;Dry needling;Taping    PT Next Visit Plan  FOTO;  recheck ROM and progress toward goals;  manual therapy as needed;  postural strengthening.     PT Home Exercise Plan  Access Code: [email or web address]   (used smartphrase rather than Medbridge for band ex's)        Patient will benefit from skilled therapeutic intervention in order to improve the following deficits and impairments:  Pain, Increased fascial restricitons, Postural dysfunction, Decreased range of motion, Decreased strength, Hypomobility  Visit Diagnosis: Cervicalgia  Abnormal posture  Muscle weakness (generalized)     Problem List Patient Active Problem List   Diagnosis Date Noted  . Ramsay Hunt syndrome (geniculate herpes zoster) 07/16/2017   Ruben Im, PT 05/21/18 9:17 AM Phone: [phone] Fax: [phone]  Alvera Singh 05/21/2018, 9:15 AM  [facility] [address] [place], [place], [address] Phone: [phone]   Fax:  [phone]  Name: Kristen Chan MRN: [id number] Date of Birth: [birth year]

## 2018-05-21 NOTE — Patient Instructions (Signed)
    Access Code: [email or web address]  URL: [email or web address]  Date: 05/21/2018  Prepared by: Ruben Im   Exercises  Seated Cervical Sidebending Stretch - 2 reps - 1 sets - 15 sec hold - 1x daily - 7x weekly  Seated Levator Scapulae Stretch - 2 reps - 1 sets - 15 sec hold - 1x daily - 7x weekly  Chest and Bicep Stretch - Arms Behind Back - 2 reps - 1 sets - 15 hold - 1x daily - 7x weekly  Seated Thoracic Lumbar Extension with Pectoralis Stretch - 2 reps - 1 sets - 15 hold - 1x daily - 7x weekly  Supine Scapular Retraction - 5 reps - 1 sets - 10 hold - 1x daily - 7x weekly  Supine Alternating Shoulder Flexion - 10 reps - 1 sets - 1x daily - 7x weekly  Supine Shoulder Horizontal Abduction with Dumbbells - 10 reps - 1 sets - 1x daily - 7x weekly  Sidelying Open Book Thoracic Lumbar Rotation and Extension - 10 reps - 1 sets - 1x daily - 7x weekly  Seated Shoulder Horizontal Abduction with Resistance - 10 reps - 3 sets - 1x daily - 7x weekly  Quadruped Transversus Abdominis Bracing - 10 reps - 1 sets - 1x daily - 7x weekly  Standing Shoulder Row with Anchored Resistance - 10 reps - 1 sets - 1x daily - 7x weekly  Standing Elbow Extension with Anchored Resistance - 10 reps - 1 sets - 1x daily - 7x weekly  Seated Assisted Cervical Rotation with Towel - 10 reps - 1 sets - 1x daily - 7x weekly  Mid-Lower Cervical Extension SNAG with Strap - 8 reps - 1 sets - 1x daily - 7x weekly  Cervical Retraction with Resistance - 8 reps - 1 sets - 1x daily - 7x weekly  Prone Shoulder Extension - 10 reps - 1 sets - 1x daily - 7x weekly  Prone Shoulder Horizontal Abduction with Thumbs Up - 10 reps - 1 sets - 1x daily - 7x weekly  Prone Single Arm Shoulder Y - 10 reps - 1 sets - 1x daily - 7x weekly      [facility] [address] [place], [place] [address] Phone # [phone] Fax [phone]

## 2018-05-24 DIAGNOSIS — Z0000 Encounter for general adult medical examination without abnormal findings: Secondary | ICD-10-CM | POA: Diagnosis not present

## 2018-05-24 DIAGNOSIS — Z5181 Encounter for therapeutic drug level monitoring: Secondary | ICD-10-CM | POA: Diagnosis not present

## 2018-05-24 DIAGNOSIS — E785 Hyperlipidemia, unspecified: Secondary | ICD-10-CM | POA: Diagnosis not present

## 2018-05-24 DIAGNOSIS — E039 Hypothyroidism, unspecified: Secondary | ICD-10-CM | POA: Diagnosis not present

## 2018-05-28 ENCOUNTER — Ambulatory Visit: Payer: BLUE CROSS/BLUE SHIELD | Admitting: Physical Therapy

## 2018-05-28 ENCOUNTER — Encounter: Payer: Self-pay | Admitting: Physical Therapy

## 2018-05-28 DIAGNOSIS — M542 Cervicalgia: Secondary | ICD-10-CM

## 2018-05-28 DIAGNOSIS — R293 Abnormal posture: Secondary | ICD-10-CM | POA: Diagnosis not present

## 2018-05-28 DIAGNOSIS — M6281 Muscle weakness (generalized): Secondary | ICD-10-CM

## 2018-05-28 NOTE — Therapy (Signed)
[facility] [address] [place], [place], [address] Phone: [phone]   Fax:  [phone]  Physical Therapy Treatment/ Recertification   Patient Details  Name: Kristen Chan MRN: [id number] Date of Birth: March 30, 1971 Referring Provider: Dr. Tomi Likens   Encounter Date: 05/28/2018  PT End of Session - 05/28/18 0841    Visit Number  10    Date for PT Re-Evaluation  07/09/18    Authorization Type  BCBS 30 visit limit    PT Start Time  0800    PT Stop Time  0850    PT Time Calculation (min)  50 min    Activity Tolerance  Patient tolerated treatment well       Past Medical History:  Diagnosis Date  . Anemia    history of anemia  . Complication of anesthesia    patient requests lightweight anesthesia  . GERD (gastroesophageal reflux disease)   . Hypothyroidism     Past Surgical History:  Procedure Laterality Date  . ABDOMINAL HYSTERECTOMY    . APPENDECTOMY    . CHOLECYSTECTOMY    . CYSTOSCOPY N/A 02/24/2015   Procedure: CYSTOSCOPY;  Surgeon: Bobbye Charleston, MD;  Location: [facility];  Service: Gynecology;  Laterality: N/A;  . KNEE ARTHROSCOPY    . MYOMECTOMY    . ROBOTIC ASSISTED BILATERAL SALPINGO OOPHERECTOMY N/A 02/24/2015   Procedure: ROBOTIC ASSISTED BILATERAL SALPINGO OOPHORECTOMY;  Surgeon: Bobbye Charleston, MD;  Location: [facility];  Service: Gynecology;  Laterality: N/A;  . thumb joint stabiliztion    . WISDOM TOOTH EXTRACTION      There were no vitals filed for this visit.  Subjective Assessment - 05/28/18 0803    Subjective  I have a headache at the base of my head.  I may have overdid Sunday night with doing all my exercises at once.      Currently in Pain?  Yes    Pain Score  2     Pain Location  Neck         [facility] PT Assessment - 05/28/18 0001      Observation/Other Assessments   Focus on Therapeutic Outcomes (FOTO)   36% limitation       AROM   Cervical Flexion  70    Cervical Extension  63     Cervical - Right Side Bend  53    Cervical - Left Side Bend  55    Cervical - Right Rotation  60    Cervical - Left Rotation  63      Strength   Cervical Flexion  4/5    Cervical Extension  4/5                   [facility] Adult PT Treatment/Exercise - 05/28/18 0001      Neck Exercises: Seated   Cervical Rotation  Right;Left;5 reps   towel assist   Other Seated Exercise  cervical extension with towel assist 5x    Other Seated Exercise  upper trap stretch 3x 10 sec holds      Neck Exercises: Supine   Neck Retraction  10 reps   on towel roll   Other Supine Exercise  melt method neck movements 8x each     Other Supine Exercise  lie on foam roll with UE movements   thoracic extension with foam roll 10x head supported      Neck Exercises: Sidelying   Other Sidelying Exercise  open books with knee propped on  foam roll 10x right and left       Moist Heat Therapy   Number Minutes Moist Heat  12 Minutes    Moist Heat Location  Cervical      Electrical Stimulation   Electrical Stimulation Location  bil cervical     Electrical Stimulation Action  IFC    Electrical Stimulation Parameters  6 ma 12 min     Electrical Stimulation Goals  Pain               PT Short Term Goals - 04/25/18 1936      PT SHORT TERM GOAL #1   Title  The patient will demonstrate knowledge of postural correction and basic knowledge of work Pharmacologist  Achieved      PT [place] #2   Title  The patient will report a 30% improvement in neck pain with usual ADLS    Status  Achieved      PT SHORT TERM GOAL #3   Title  The patient will have improved cervical right sidebending to 30 degrees and extension to 60 degrees needed for driving and outdoor activities    Status  Achieved        PT Long Term Goals - 05/28/18 1321      PT [place] #1   Title  The patient will be independent in safe self progression of HEP      Time  6    Period  Weeks    Status   On-going    Target Date  07/09/18      PT LONG TERM GOAL #2   Title  The patient will report a 60% improvement in neck pain with work and home activities    Baseline  80-85% better    Status  Achieved      PT LONG TERM GOAL #3   Title  The patient will have improved neck and and scapular muscle strength to grossly 4/5 needed for sitting erect for longer periods of time    Time  6    Period  Weeks    Status  On-going      PT LONG TERM GOAL #4   Title  The patient will have full cervical ROM with minimal to no pain needed for driving and other usual ADLS and recreational activities    Time  6    Period  Weeks    Status  On-going      PT LONG TERM GOAL #5   Title  FOTO functional outcome score improved from 41% limitation to 32% indicating improved function with less pain.      Time  6    Period  Weeks    Status  On-going            Plan - 05/28/18 4709    Clinical Impression Statement  Good improvements in cervical ROM since initial evaluation especially rotation.  She rates her overall improvement at 80% although she continues to have periodic flare ups which concern her.  Will decrease treatment frequency to 1x/week for every other week for up to 6 more weeks (3-4 visits total) to promote independence with HEP and to ensure she continues to make further improvements in pain reduction.      Rehab Potential  Good    Clinical Impairments Affecting Rehab Potential  recent history of shingles affecting cranial nerves;  osteophyte C5-6 right     PT Frequency  2x / week    PT Duration  6 weeks    PT Treatment/Interventions  ADLs/Self Care Home Management;Cryotherapy;Electrical Stimulation;Traction;Ultrasound;Moist Heat;Therapeutic activities;Therapeutic exercise;Patient/family education;Neuromuscular re-education;Manual techniques;Dry needling;Taping    PT Next Visit Plan  DN and  manual therapy as needed;  postural strengthening.  follow up in 2 weeks     PT Home Exercise Plan   Access Code: [email or web address]   (used smartphrase rather than Medbridge for band ex's)        Patient will benefit from skilled therapeutic intervention in order to improve the following deficits and impairments:  Pain, Increased fascial restricitons, Postural dysfunction, Decreased range of motion, Decreased strength, Hypomobility  Visit Diagnosis: Cervicalgia - Plan: PT plan of care cert/re-cert  Abnormal posture - Plan: PT plan of care cert/re-cert  Muscle weakness (generalized) - Plan: PT plan of care cert/re-cert     Problem List Patient Active Problem List   Diagnosis Date Noted  . Ramsay Hunt syndrome (geniculate herpes zoster) 07/16/2017   Ruben Im, PT 05/28/18 1:22 PM Phone: [phone] Fax: [phone] Alvera Singh 05/28/2018, 1:22 PM  [facility] [address], [place], [place], [address] Phone: [phone]   Fax:  [phone]  Name: Kristen Chan MRN: [id number] Date of Birth: [birth year]

## 2018-06-13 ENCOUNTER — Encounter: Payer: Self-pay | Admitting: Physical Therapy

## 2018-06-13 ENCOUNTER — Ambulatory Visit: Payer: BLUE CROSS/BLUE SHIELD | Attending: Neurology | Admitting: Physical Therapy

## 2018-06-13 DIAGNOSIS — R293 Abnormal posture: Secondary | ICD-10-CM

## 2018-06-13 DIAGNOSIS — M542 Cervicalgia: Secondary | ICD-10-CM | POA: Diagnosis not present

## 2018-06-13 DIAGNOSIS — M6281 Muscle weakness (generalized): Secondary | ICD-10-CM | POA: Insufficient documentation

## 2018-06-13 NOTE — Therapy (Signed)
[facility] [address], [place], [place], [address] Phone: [phone]   Fax:  [phone]  Physical Therapy Treatment  Patient Details  Name: Kristen Chan MRN: [id number] Date of Birth: [birth year] Referring Provider: Dr. Tomi Likens   Encounter Date: 06/13/2018  PT End of Session - 06/13/18 0758    Visit Number  11    Date for PT Re-Evaluation  07/09/18    Authorization Type  BCBS 30 visit limit    PT Start Time  0726    PT Stop Time  0810    PT Time Calculation (min)  44 min    Activity Tolerance  Patient tolerated treatment well       Past Medical History:  Diagnosis Date  . Anemia    history of anemia  . Complication of anesthesia    patient requests lightweight anesthesia  . GERD (gastroesophageal reflux disease)   . Hypothyroidism     Past Surgical History:  Procedure Laterality Date  . ABDOMINAL HYSTERECTOMY    . APPENDECTOMY    . CHOLECYSTECTOMY    . CYSTOSCOPY N/A 02/24/2015   Procedure: CYSTOSCOPY;  Surgeon: Bobbye Charleston, MD;  Location: [facility];  Service: Gynecology;  Laterality: N/A;  . KNEE ARTHROSCOPY    . MYOMECTOMY    . ROBOTIC ASSISTED BILATERAL SALPINGO OOPHERECTOMY N/A 02/24/2015   Procedure: ROBOTIC ASSISTED BILATERAL SALPINGO OOPHORECTOMY;  Surgeon: Bobbye Charleston, MD;  Location: [facility];  Service: Gynecology;  Laterality: N/A;  . thumb joint stabiliztion    . WISDOM TOOTH EXTRACTION      There were no vitals filed for this visit.  Subjective Assessment - 06/13/18 0725    Subjective  For the most part feeling pretty good.  Left sided neck tightness the last few days.  On Sunday had a posterior headache.      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  Yes    Pain Score  1     Pain Location  Neck    Pain Orientation  Left    Pain Type  Chronic pain    Pain Relieving Factors  exercises, tennis  balls in sock                       [facility] Adult PT Treatment/Exercise - 06/13/18 0001      Neck Exercises: Theraband   Other Theraband Exercises  scapular band series red band on foam roll 10x each       Neck Exercises: Supine   Other Supine Exercise  melt method neck movements 8x each     Other Supine Exercise  lie on foam roll with UE movements   thoracic extension with foam roll 10x head supported      Neck Exercises: Sidelying   Other Sidelying Exercise  open books with knee propped on foam roll 10x right and left with red band       Neck Exercises: Prone   Other Prone Exercise  off corner of mat       Shoulder Exercises: ROM/Strengthening   Other ROM/Strengthening Exercises  quadruped green band shoulder protraction 10x    Other ROM/Strengthening Exercises  quadruped open books 10x right/left      Moist Heat Therapy   Number Minutes Moist Heat  13 Minutes    Moist Heat Location  Cervical  Advertising account executive  IFC    Electrical Stimulation Parameters  6 ma 13 min     Electrical Stimulation Goals  Pain               PT Short Term Goals - 04/25/18 1936      PT SHORT TERM GOAL #1   Title  The patient will demonstrate knowledge of postural correction and basic knowledge of work Pharmacologist  Achieved      PT [place] #2   Title  The patient will report a 30% improvement in neck pain with usual ADLS    Status  Achieved      PT SHORT TERM GOAL #3   Title  The patient will have improved cervical right sidebending to 30 degrees and extension to 60 degrees needed for driving and outdoor activities    Status  Achieved        PT Long Term Goals - 05/28/18 1321      PT [place] #1   Title  The patient will be independent in safe self progression of HEP      Time  6    Period  Weeks    Status  On-going    Target Date  07/09/18       PT LONG TERM GOAL #2   Title  The patient will report a 60% improvement in neck pain with work and home activities    Baseline  80-85% better    Status  Achieved      PT LONG TERM GOAL #3   Title  The patient will have improved neck and and scapular muscle strength to grossly 4/5 needed for sitting erect for longer periods of time    Time  6    Period  Weeks    Status  On-going      PT LONG TERM GOAL #4   Title  The patient will have full cervical ROM with minimal to no pain needed for driving and other usual ADLS and recreational activities    Time  6    Period  Weeks    Status  On-going      PT LONG TERM GOAL #5   Title  FOTO functional outcome score improved from 41% limitation to 32% indicating improved function with less pain.      Time  6    Period  Weeks    Status  On-going            Plan - 06/13/18 0759    Clinical Impression Statement  The patient reports overall minimal left upper trapezius region and posterior headache pain only intermittently.  Pain is low intensity 1-2/10.  She demonstrates good compliance with HEP and is able to add resistance to some exercises without difficulty or pain exacerbation.  Verbal cues for neutral head positioning in quadruped and prone.  She reports relief with exercises (particularly prone Ts, Ys) as well as ES/heat.      Rehab Potential  Good    Clinical Impairments Affecting Rehab Potential  recent history of shingles affecting cranial nerves;  osteophyte C5-6 right     PT Frequency  2x / week    PT Duration  6 weeks    PT Treatment/Interventions  ADLs/Self Care Home Management;Cryotherapy;Electrical Stimulation;Traction;Ultrasound;Moist Heat;Therapeutic activities;Therapeutic exercise;Patient/family education;Neuromuscular re-education;Manual techniques;Dry needling;Taping    PT Next Visit Plan  DN and  manual therapy as needed;  postural strengthening.  follow up in 2 weeks;  recheck progress toward goals, ROM, FOTO for  possible discharge from PT    PT Home Exercise Plan  Access Code: [email or web address]   (used smartphrase rather than Medbridge for band ex's)        Patient will benefit from skilled therapeutic intervention in order to improve the following deficits and impairments:  Pain, Increased fascial restricitons, Postural dysfunction, Decreased range of motion, Decreased strength, Hypomobility  Visit Diagnosis: Cervicalgia  Abnormal posture  Muscle weakness (generalized)     Problem List Patient Active Problem List   Diagnosis Date Noted  . Ramsay Hunt syndrome (geniculate herpes zoster) 07/16/2017   Ruben Im, PT 06/13/18 9:10 AM Phone: [phone] Fax: [phone]  Alvera Singh 06/13/2018, 9:09 AM  [facility] [address], [place], [place], [address] Phone: [phone]   Fax:  [phone]  Name: Kristen Chan MRN: [id number] Date of Birth: Jan 24, 1971

## 2018-06-27 ENCOUNTER — Ambulatory Visit: Payer: BLUE CROSS/BLUE SHIELD | Admitting: Physical Therapy

## 2018-06-27 DIAGNOSIS — R293 Abnormal posture: Secondary | ICD-10-CM | POA: Diagnosis not present

## 2018-06-27 DIAGNOSIS — M542 Cervicalgia: Secondary | ICD-10-CM

## 2018-06-27 DIAGNOSIS — M6281 Muscle weakness (generalized): Secondary | ICD-10-CM

## 2018-06-27 NOTE — Therapy (Signed)
[facility] [address], [place], [place], [address] Phone: [phone]   Fax:  [phone]  Physical Therapy Treatment  Patient Details  Name: Kristen Chan MRN: [id number] Date of Birth: [birth year] Referring Provider: Dr. Tomi Likens   Encounter Date: 06/27/2018  PT End of Session - 06/27/18 0807    Visit Number  12    Date for PT Re-Evaluation  07/09/18    Authorization Type  BCBS 30 visit limit    Authorization - Number of Visits  30    PT Start Time  0724    PT Stop Time  0813    PT Time Calculation (min)  49 min    Activity Tolerance  Patient tolerated treatment well       Past Medical History:  Diagnosis Date  . Anemia    history of anemia  . Complication of anesthesia    patient requests lightweight anesthesia  . GERD (gastroesophageal reflux disease)   . Hypothyroidism     Past Surgical History:  Procedure Laterality Date  . ABDOMINAL HYSTERECTOMY    . APPENDECTOMY    . CHOLECYSTECTOMY    . CYSTOSCOPY N/A 02/24/2015   Procedure: CYSTOSCOPY;  Surgeon: Bobbye Charleston, MD;  Location: [facility];  Service: Gynecology;  Laterality: N/A;  . KNEE ARTHROSCOPY    . MYOMECTOMY    . ROBOTIC ASSISTED BILATERAL SALPINGO OOPHERECTOMY N/A 02/24/2015   Procedure: ROBOTIC ASSISTED BILATERAL SALPINGO OOPHORECTOMY;  Surgeon: Bobbye Charleston, MD;  Location: [facility];  Service: Gynecology;  Laterality: N/A;  . thumb joint stabiliztion    . WISDOM TOOTH EXTRACTION      There were no vitals filed for this visit.  Subjective Assessment - 06/27/18 0800    Subjective  I've been hurting in both my shoulders and going down my neck.  I'm almost convinced it's my work station.  My shoulders wind up elevated.  It was flared prior to the weekend and continued throught the weekend.      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  Yes    Pain Score  5      Pain Location  Neck    Pain Orientation  Right;Left    Pain Type  Chronic pain    Pain Onset  More than a month ago    Aggravating Factors   work set up    Pain Relieving Factors  exercises help                       [facility] Adult PT Treatment/Exercise - 06/27/18 0001      Therapeutic Activites    Other Therapeutic Activities  discussed work station set up for postural alignment       Shoulder Exercises: ROM/Strengthening   Other ROM/Strengthening Exercises  UBE 2 forward/2 backward    while discussing status/progress     Acupuncturist Location  bil cervical     Electrical Stimulation Action  IFC    Electrical Stimulation Parameters  6 ma 13 min     Electrical Stimulation Goals  Pain      Manual Therapy   Manual therapy comments  contract relax upper traps right/left 3x 5    Soft tissue mobilization  suboccipital release, upper traps, cervical paraspinals, levator scapulae bilaterally     Manual Traction  3x 20  sec                PT Short Term Goals - 04/25/18 1936      PT SHORT TERM GOAL #1   Title  The patient will demonstrate knowledge of postural correction and basic knowledge of work Pharmacologist  Achieved      PT [place] #2   Title  The patient will report a 30% improvement in neck pain with usual ADLS    Status  Achieved      PT SHORT TERM GOAL #3   Title  The patient will have improved cervical right sidebending to 30 degrees and extension to 60 degrees needed for driving and outdoor activities    Status  Achieved        PT Long Term Goals - 05/28/18 1321      PT [place] #1   Title  The patient will be independent in safe self progression of HEP      Time  6    Period  Weeks    Status  On-going    Target Date  07/09/18      PT LONG TERM GOAL #2   Title  The patient will report a 60% improvement in neck pain with work and home activities    Baseline  80-85%  better    Status  Achieved      PT LONG TERM GOAL #3   Title  The patient will have improved neck and and scapular muscle strength to grossly 4/5 needed for sitting erect for longer periods of time    Time  6    Period  Weeks    Status  On-going      PT LONG TERM GOAL #4   Title  The patient will have full cervical ROM with minimal to no pain needed for driving and other usual ADLS and recreational activities    Time  6    Period  Weeks    Status  On-going      PT LONG TERM GOAL #5   Title  FOTO functional outcome score improved from 41% limitation to 32% indicating improved function with less pain.      Time  6    Period  Weeks    Status  On-going            Plan - 06/27/18 0807    Clinical Impression Statement  The patient has numerous tender points especially in right > left upper trapezeii and right > left suboccipitals.  Following DN and manual therapy, she has much improved soft tissue lengths and decreased tender point size and number.  Discussed ideal work station set up to avoid excessive prolonged shoulder shrug and head forward posture.  Recommend frequent stretch breaks every 30-45 minutes.  Also recommend home TENs unit for pain management since she has such good relief with ES in the clinic.  She demonstrates good compliance with HEP.  Therapist closely monitoring response with all treatment interventions.  No additional goals met secondary to recent flare up.      Rehab Potential  Good    Clinical Impairments Affecting Rehab Potential  recent history of shingles affecting cranial nerves;  osteophyte C5-6 right     PT Frequency  2x / week    PT Duration  6 weeks    PT Treatment/Interventions  ADLs/Self Care Home Management;Cryotherapy;Electrical Stimulation;Traction;Ultrasound;Moist Heat;Therapeutic activities;Therapeutic exercise;Patient/family education;Neuromuscular re-education;Manual techniques;Dry needling;Taping  PT Next Visit Plan  DN and  manual therapy as  needed;  postural strengthening;  instruct in home TENs as needed;  prone Is, Ys, Ts         Patient will benefit from skilled therapeutic intervention in order to improve the following deficits and impairments:  Pain, Increased fascial restricitons, Postural dysfunction, Decreased range of motion, Decreased strength, Hypomobility  Visit Diagnosis: Cervicalgia  Abnormal posture  Muscle weakness (generalized)     Problem List Patient Active Problem List   Diagnosis Date Noted  . Ramsay Hunt syndrome (geniculate herpes zoster) 07/16/2017   Ruben Im, PT 06/27/18 11:33 AM Phone: [phone] Fax: [phone]  Alvera Singh 06/27/2018, 11:31 AM  [facility] [address] [place], [place], [address] Phone: [phone]   Fax:  [phone]  Name: YALISSA FINK MRN: [id number] Date of Birth: [birth year]

## 2018-07-05 ENCOUNTER — Encounter

## 2018-07-09 ENCOUNTER — Encounter: Payer: Self-pay | Admitting: Physical Therapy

## 2018-07-09 ENCOUNTER — Ambulatory Visit: Payer: BLUE CROSS/BLUE SHIELD | Attending: Neurology | Admitting: Physical Therapy

## 2018-07-09 DIAGNOSIS — M542 Cervicalgia: Secondary | ICD-10-CM

## 2018-07-09 DIAGNOSIS — R293 Abnormal posture: Secondary | ICD-10-CM | POA: Diagnosis not present

## 2018-07-09 DIAGNOSIS — M6281 Muscle weakness (generalized): Secondary | ICD-10-CM

## 2018-07-09 NOTE — Therapy (Addendum)
[facility] [address] [place], [place], [address] Phone: [phone]   Fax:  [phone]  Physical Therapy Treatment/Recertification [place] Summary   Patient Details  Name: Kristen Chan MRN: [id number] Date of Birth: 1971/09/22 Referring Provider (PT): Dr. Tomi Likens   Encounter Date: 07/09/2018  PT End of Session - 07/09/18 1841    Visit Number  13    Date for PT Re-Evaluation  08/13/18    Authorization Type  [employer] 30 visit limit    Authorization - Number of Visits  30    PT Start Time  0730    PT Stop Time  0800   30 min treatment slot   PT Time Calculation (min)  30 min    Activity Tolerance  Patient tolerated treatment well       Past Medical History:  Diagnosis Date  . Anemia    history of anemia  . Complication of anesthesia    patient requests lightweight anesthesia  . GERD (gastroesophageal reflux disease)   . Hypothyroidism     Past Surgical History:  Procedure Laterality Date  . ABDOMINAL HYSTERECTOMY    . APPENDECTOMY    . CHOLECYSTECTOMY    . CYSTOSCOPY N/A 02/24/2015   Procedure: CYSTOSCOPY;  Surgeon: Bobbye Charleston, MD;  Location: [facility];  Service: Gynecology;  Laterality: N/A;  . KNEE ARTHROSCOPY    . MYOMECTOMY    . ROBOTIC ASSISTED BILATERAL SALPINGO OOPHERECTOMY N/A 02/24/2015   Procedure: ROBOTIC ASSISTED BILATERAL SALPINGO OOPHORECTOMY;  Surgeon: Bobbye Charleston, MD;  Location: [facility];  Service: Gynecology;  Laterality: N/A;  . thumb joint stabiliztion    . WISDOM TOOTH EXTRACTION      There were no vitals filed for this visit.  Subjective Assessment - 07/09/18 0731    Subjective  Right (rather than left sided) neck pain.  Got home TENS and has been using.      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  Yes    Pain Score  4     Pain Location  Neck    Pain Orientation  Right    Pain Type  Chronic pain         [facility] PT Assessment - 07/09/18 0001      Observation/Other Assessments   Focus on Therapeutic Outcomes (FOTO)   31% limitation       AROM   Cervical Flexion  75    Cervical Extension  70    Cervical - Right Side Bend  68    Cervical - Left Side Bend  63    Cervical - Right Rotation  60    Cervical - Left Rotation  70      Strength   Cervical Flexion  4+/5    Cervical Extension  4+/5                   [facility] Adult PT Treatment/Exercise - 07/09/18 0001      Self-Care   Self-Care  Other Self-Care Comments    Other Self-Care Comments   home TENs set up       Neck Exercises: Seated   Other Seated Exercise  red band openers 15x    Other Seated Exercise  open  books 10x right/left      Neck Exercises: Supine   Other Supine Exercise  lying on foam roll with UE swim motions    Other Supine Exercise  lie on foam roll with UE movements   thoracic extension with foam roll 10x head supported      Neck Exercises: Prone   Other Prone Exercise  shoulder I, T, Y with head lift 5x each    off corner of mat     Shoulder Exercises: ROM/Strengthening   Other ROM/Strengthening Exercises  UBE 2 forward/2 backward    while discussing status/progress   Other ROM/Strengthening Exercises  quadruped open books 10x right/left               PT Short Term Goals - 07/09/18 1911      PT SHORT TERM GOAL #1   Title  The patient will demonstrate knowledge of postural correction and basic knowledge of work Pharmacologist  Achieved      PT SHORT TERM GOAL #2   Title  The patient will report a 30% improvement in neck pain with usual ADLS    Status  Achieved        PT Long Term Goals - 07/09/18 1912      PT LONG TERM GOAL #1   Title  The patient will be independent in safe self progression of HEP      Time  5    Period  Weeks     Target Date  08/13/18      PT LONG TERM GOAL #2   Title  The patient will report a 60% improvement in neck pain with work and home activities    Status  Achieved      PT LONG TERM GOAL #3   Title  The patient will have improved neck and and scapular muscle strength to grossly 4/5 needed for sitting erect for longer periods of time    Time  5    Period  Weeks    Status  On-going      PT LONG TERM GOAL #4   Title  The patient will have full cervical ROM with minimal to no pain needed for driving and other usual ADLS and recreational activities    Status  Achieved      PT LONG TERM GOAL #5   Title  FOTO functional outcome score improved from 41% limitation to 32% indicating improved function with less pain.      Status  Achieved            Plan - 07/09/18 1842    Clinical Impression Statement  The patient reports some mild neck pain on right side rather than the usual left side.  She declines the need for manual techniques and modalities and reports pain is lessened with exercise.  She has much improved cervical ROM in all planes and a much improved FOTO functional outcome score.  Good progress with rehab goals.  She is concerned about being discharged from PT yet and we agreed to see how she does on her own for 3-4 weeks and then follow up at that time for formal discharge from PT.      Rehab Potential  Good    Clinical Impairments Affecting Rehab Potential  recent history of shingles affecting cranial nerves;  osteophyte C5-6 right     PT Frequency  Biweekly    PT Duration  6 weeks    PT Treatment/Interventions  ADLs/Self Care Home Management;Cryotherapy;Electrical Stimulation;Traction;Ultrasound;Moist Heat;Therapeutic activities;Therapeutic exercise;Patient/family education;Neuromuscular re-education;Manual techniques;Dry needling;Taping    PT Next Visit Plan  follow up in 3-4 weeks for probable discharge    PT Home Exercise Plan  Access Code: [email or web address]   (used smartphrase rather  than Medbridge for band ex's)         PHYSICAL THERAPY DISCHARGE SUMMARY  Visits from Start of Care: 13  Current functional level related to goals / functional outcomes: The patient called to cancel her last scheduled appt stating she was doing better.  She has met the majority of rehab goals.  See clinical impression statement above.     Remaining deficits: As Above   Education / Equipment: Comprehensive HEP Plan: Patient agrees to discharge.  Patient goals were partially met. Patient is being discharged due to being pleased with the current functional level.  ?????        Patient will benefit from skilled therapeutic intervention in order to improve the following deficits and impairments:  Pain, Increased fascial restricitons, Postural dysfunction, Decreased range of motion, Decreased strength, Hypomobility  Visit Diagnosis: Cervicalgia - Plan: PT plan of care cert/re-cert  Abnormal posture - Plan: PT plan of care cert/re-cert  Muscle weakness (generalized) - Plan: PT plan of care cert/re-cert     Problem List Patient Active Problem List   Diagnosis Date Noted  . Ramsay Hunt syndrome (geniculate herpes zoster) 07/16/2017   Ruben Im, PT 07/09/18 7:15 PM Phone: [phone] Fax: [phone]  Alvera Singh 07/09/2018, 7:15 PM  [facility] [address] [employer] [place], [place], [address] Phone: [phone]   Fax:  [phone]  Name: Kristen Chan MRN: [id number] Date of Birth: [birth year]

## 2018-07-17 DIAGNOSIS — E039 Hypothyroidism, unspecified: Secondary | ICD-10-CM | POA: Diagnosis not present

## 2018-08-08 ENCOUNTER — Ambulatory Visit: Payer: BLUE CROSS/BLUE SHIELD | Admitting: Physical Therapy

## 2018-09-06 ENCOUNTER — Ambulatory Visit: Payer: BLUE CROSS/BLUE SHIELD | Admitting: Neurology

## 2018-10-14 DIAGNOSIS — H9311 Tinnitus, right ear: Secondary | ICD-10-CM | POA: Diagnosis not present

## 2018-11-04 DIAGNOSIS — J029 Acute pharyngitis, unspecified: Secondary | ICD-10-CM | POA: Diagnosis not present

## 2018-11-04 DIAGNOSIS — R05 Cough: Secondary | ICD-10-CM | POA: Diagnosis not present

## 2018-11-04 DIAGNOSIS — R067 Sneezing: Secondary | ICD-10-CM | POA: Diagnosis not present

## 2018-11-04 DIAGNOSIS — J209 Acute bronchitis, unspecified: Secondary | ICD-10-CM | POA: Diagnosis not present

## 2018-11-13 DIAGNOSIS — R05 Cough: Secondary | ICD-10-CM | POA: Diagnosis not present

## 2018-11-13 DIAGNOSIS — J209 Acute bronchitis, unspecified: Secondary | ICD-10-CM | POA: Diagnosis not present

## 2018-11-13 DIAGNOSIS — J111 Influenza due to unidentified influenza virus with other respiratory manifestations: Secondary | ICD-10-CM | POA: Diagnosis not present

## 2019-03-20 DIAGNOSIS — Z6823 Body mass index (BMI) 23.0-23.9, adult: Secondary | ICD-10-CM | POA: Diagnosis not present

## 2019-03-20 DIAGNOSIS — Z1231 Encounter for screening mammogram for malignant neoplasm of breast: Secondary | ICD-10-CM | POA: Diagnosis not present

## 2019-03-20 DIAGNOSIS — Z01419 Encounter for gynecological examination (general) (routine) without abnormal findings: Secondary | ICD-10-CM | POA: Diagnosis not present

## 2019-03-24 DIAGNOSIS — L718 Other rosacea: Secondary | ICD-10-CM | POA: Diagnosis not present

## 2019-05-01 DIAGNOSIS — J0101 Acute recurrent maxillary sinusitis: Secondary | ICD-10-CM | POA: Diagnosis not present

## 2019-06-24 DIAGNOSIS — Z23 Encounter for immunization: Secondary | ICD-10-CM | POA: Diagnosis not present

## 2019-06-24 DIAGNOSIS — E039 Hypothyroidism, unspecified: Secondary | ICD-10-CM | POA: Diagnosis not present

## 2019-06-24 DIAGNOSIS — Z0000 Encounter for general adult medical examination without abnormal findings: Secondary | ICD-10-CM | POA: Diagnosis not present

## 2019-06-24 DIAGNOSIS — E785 Hyperlipidemia, unspecified: Secondary | ICD-10-CM | POA: Diagnosis not present

## 2019-06-24 DIAGNOSIS — Z5181 Encounter for therapeutic drug level monitoring: Secondary | ICD-10-CM | POA: Diagnosis not present

## 2019-08-22 ENCOUNTER — Other Ambulatory Visit: Payer: Self-pay

## 2019-08-22 DIAGNOSIS — Z20822 Contact with and (suspected) exposure to covid-19: Secondary | ICD-10-CM

## 2019-08-24 LAB — NOVEL CORONAVIRUS, NAA: SARS-CoV-2, NAA: NOT DETECTED

## 2019-08-25 DIAGNOSIS — B349 Viral infection, unspecified: Secondary | ICD-10-CM | POA: Diagnosis not present

## 2019-10-15 DIAGNOSIS — Z1159 Encounter for screening for other viral diseases: Secondary | ICD-10-CM | POA: Diagnosis not present

## 2019-10-20 DIAGNOSIS — Z1211 Encounter for screening for malignant neoplasm of colon: Secondary | ICD-10-CM | POA: Diagnosis not present

## 2019-12-10 DIAGNOSIS — E039 Hypothyroidism, unspecified: Secondary | ICD-10-CM | POA: Diagnosis not present

## 2020-04-07 DIAGNOSIS — L237 Allergic contact dermatitis due to plants, except food: Secondary | ICD-10-CM | POA: Diagnosis not present

## 2020-04-17 DIAGNOSIS — U071 COVID-19: Secondary | ICD-10-CM | POA: Diagnosis not present

## 2020-04-18 DIAGNOSIS — R21 Rash and other nonspecific skin eruption: Secondary | ICD-10-CM | POA: Diagnosis not present

## 2020-04-18 DIAGNOSIS — J029 Acute pharyngitis, unspecified: Secondary | ICD-10-CM | POA: Diagnosis not present

## 2020-05-14 DIAGNOSIS — Z6824 Body mass index (BMI) 24.0-24.9, adult: Secondary | ICD-10-CM | POA: Diagnosis not present

## 2020-05-14 DIAGNOSIS — Z1231 Encounter for screening mammogram for malignant neoplasm of breast: Secondary | ICD-10-CM | POA: Diagnosis not present

## 2020-05-14 DIAGNOSIS — Z01419 Encounter for gynecological examination (general) (routine) without abnormal findings: Secondary | ICD-10-CM | POA: Diagnosis not present

## 2020-06-15 IMAGING — MR MR CERVICAL SPINE W/O CM
5 series · 29 of 48 positions shown · non-contrast
Comparison: None available.

CLINICAL DATA: Initial evaluation for chronic neck pain with
bilateral shoulder pain, headaches.

EXAM:
MRI CERVICAL SPINE WITHOUT CONTRAST
TECHNIQUE: Multiplanar, multisequence MR imaging of the cervical spine was
performed. No intravenous contrast was administered.

[Series 2: T2 · sagittal · 3.0mm · 0.41mm/px · 6 of 13 slices shown (1 of 2)]
[im 1/13]
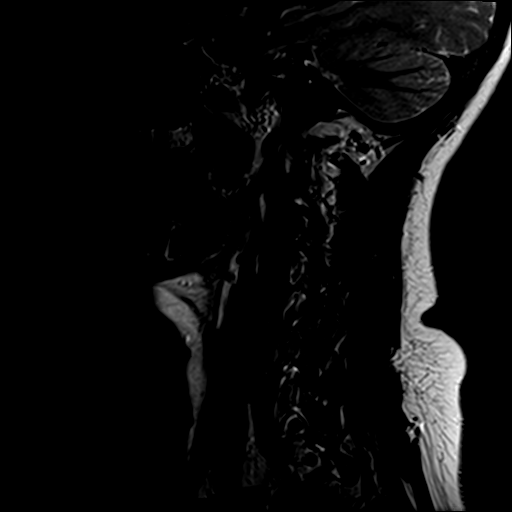
[im 3/13]
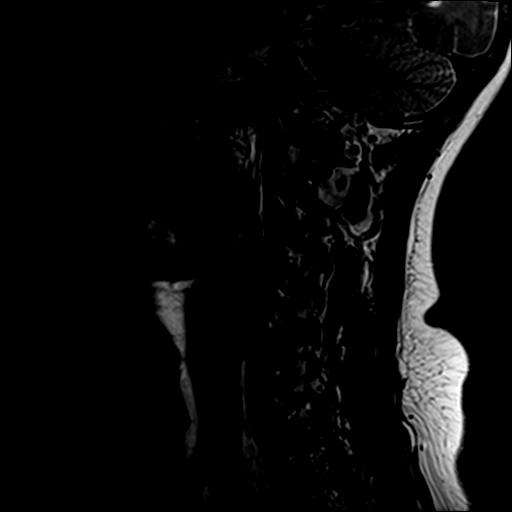
[im 5/13]
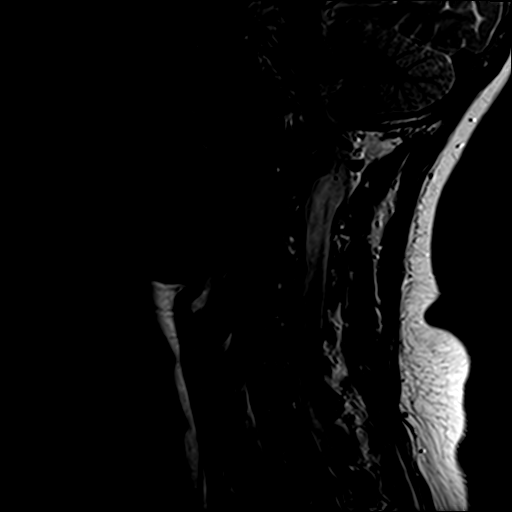
[im 8/13]
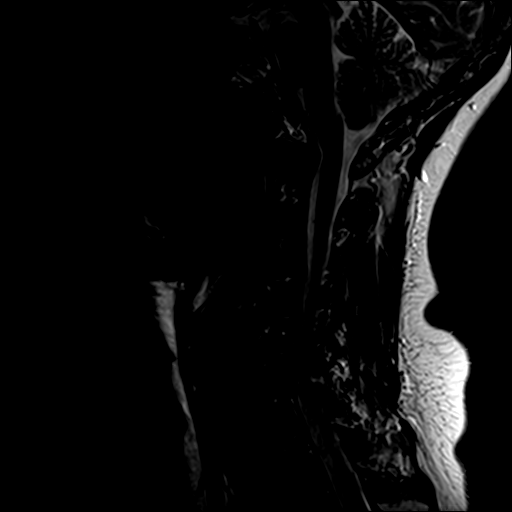
[im 10/13]
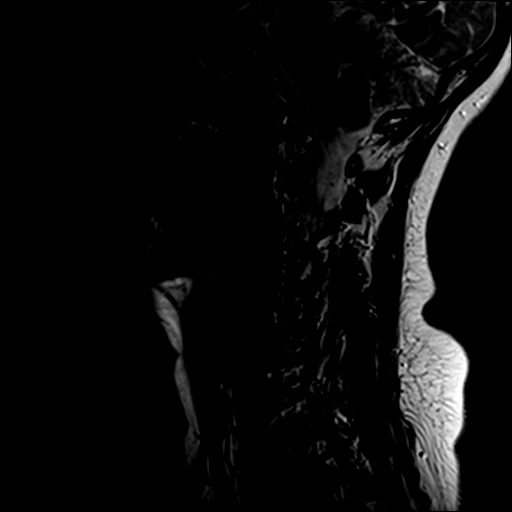
[im 13/13]
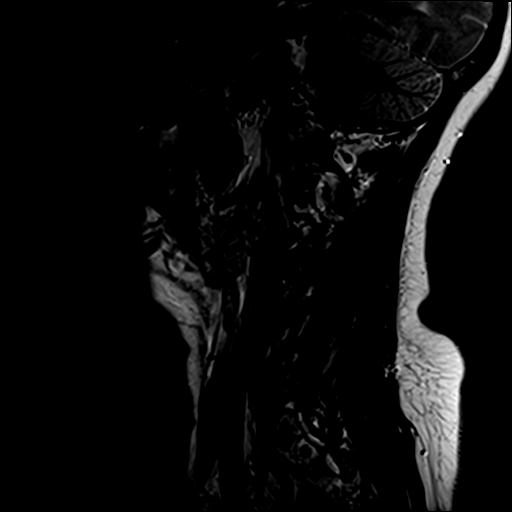

[Series 3: T1 · sagittal · 3.0mm · 0.41mm/px · 7 of 13 slices shown]
[im 1/13]
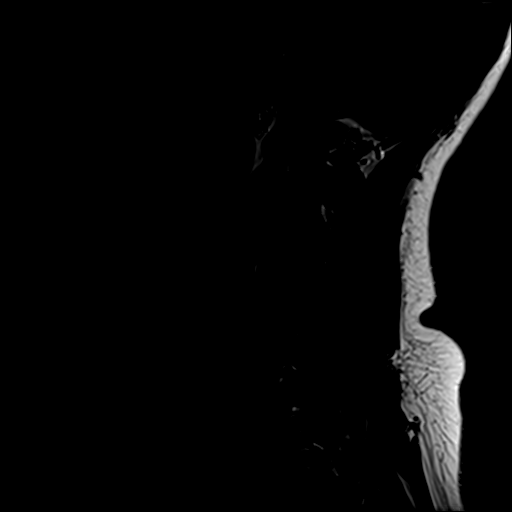
[im 3/13]
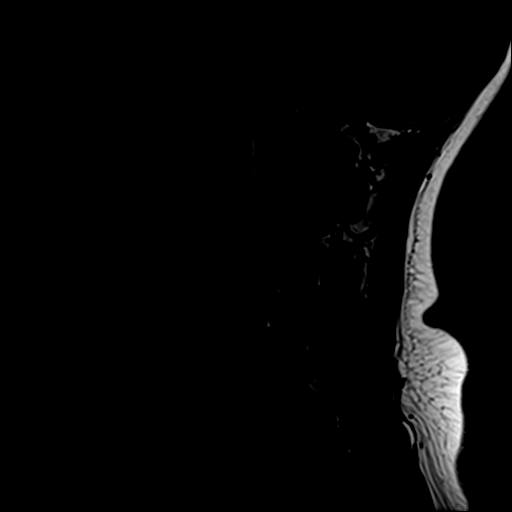
[im 5/13]
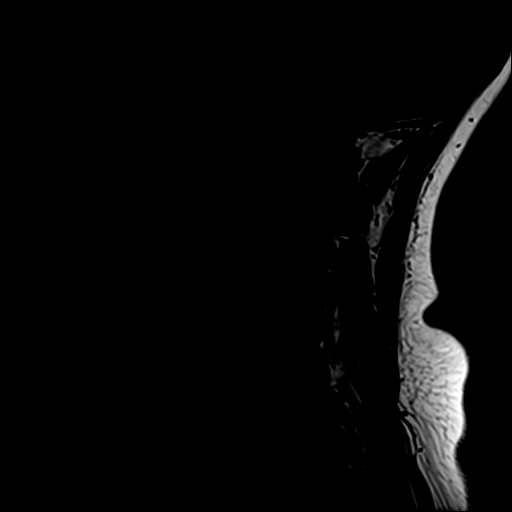
[im 7/13]
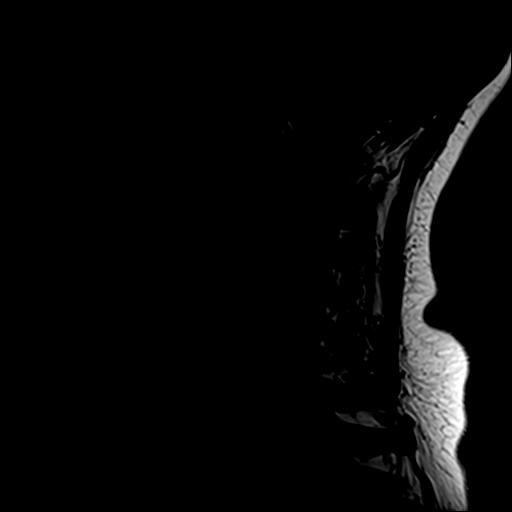
[im 9/13]
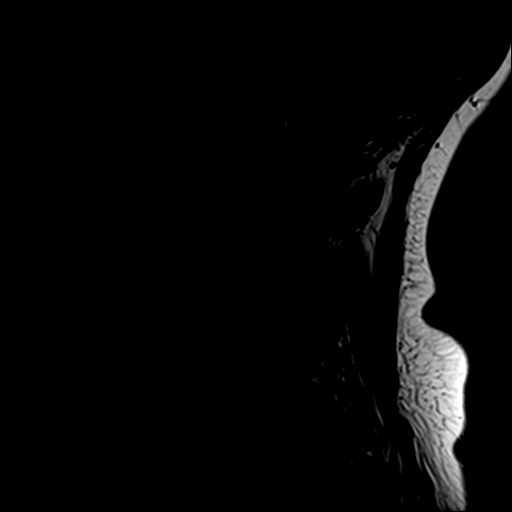
[im 11/13]
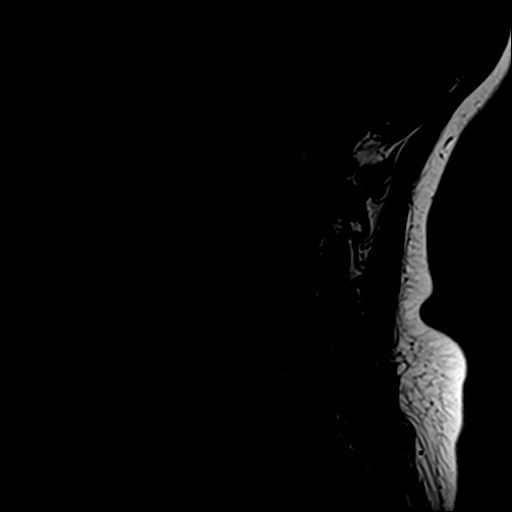
[im 13/13]
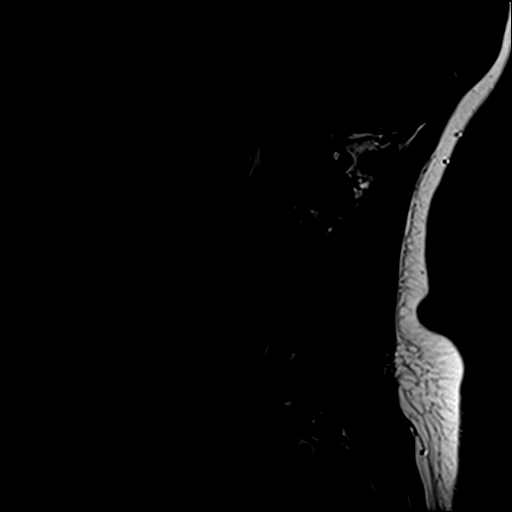

[Series 4: STIR · sagittal · 3.0mm · 0.82mm/px · 7 of 13 slices shown]
[im 1/13]
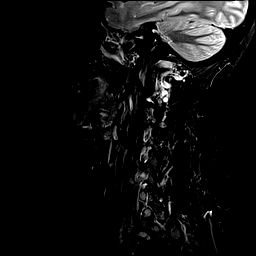
[im 3/13]
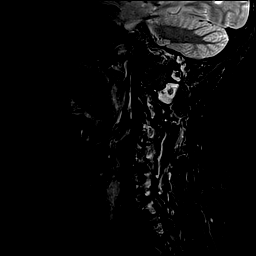
[im 5/13]
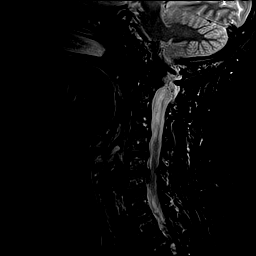
[im 7/13]
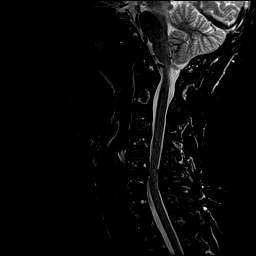
[im 9/13]
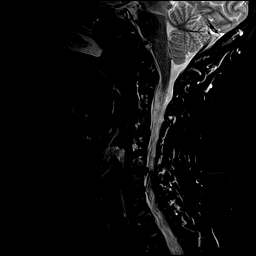
[im 11/13]
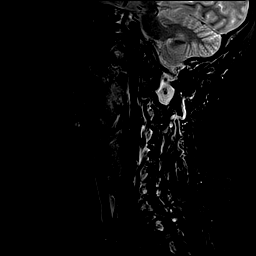
[im 13/13]
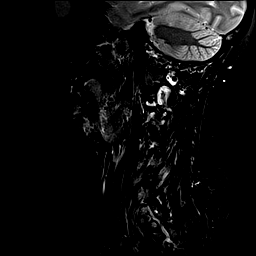

[Series 5: GRE · axial · 3.0mm · 0.35mm/px · 1 of 28 slices shown]
[im 1/28]
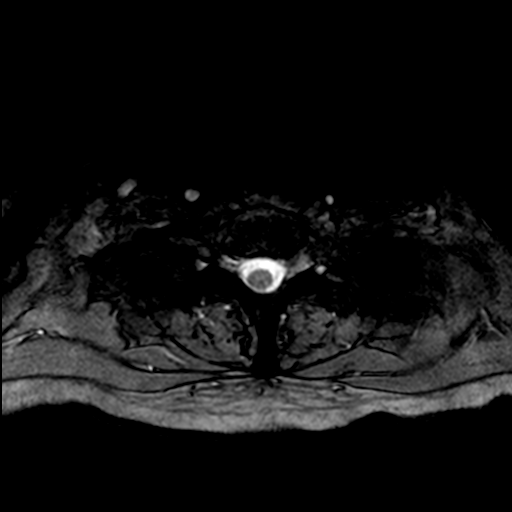

[Series 6: T2 · axial · 3.0mm · 0.70mm/px · z∈[-98,+4]mm · 8 of 28 slices shown (2 of 2)]
[im 1/28]
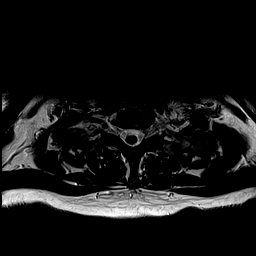
[im 5/28]
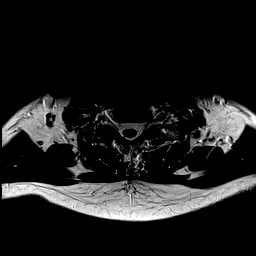
[im 9/28]
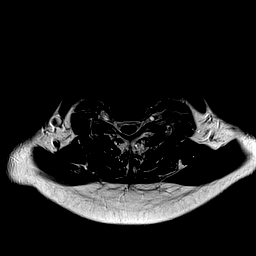
[im 13/28]
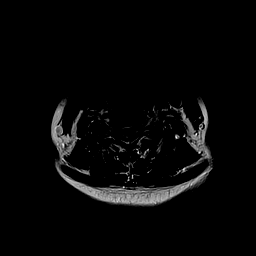
[im 15/28]
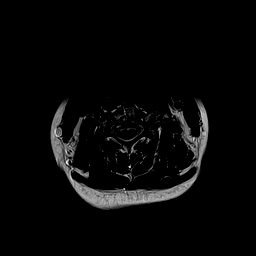
[im 19/28]
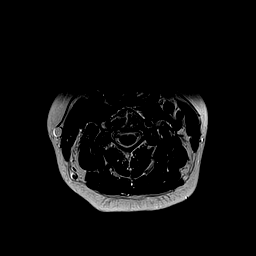
[im 23/28]
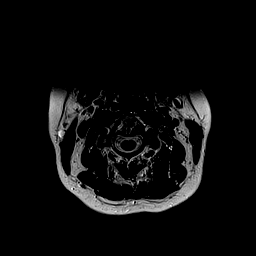
[im 28/28]
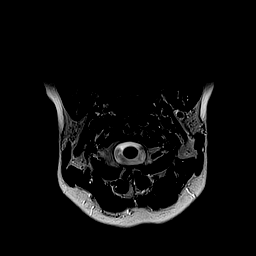

[29 of 48 positions shown; findings below may reference images not displayed]

FINDINGS: Alignment: Mild straightening of the normal cervical lordosis. Trace
retrolisthesis of C5 on C6, likely chronic and facet mediated.

Vertebrae: Vertebral body heights maintained without evidence for
acute or chronic fracture. Bone marrow signal intensity within
normal limits. Probable atypical hemangioma noted within the C2
vertebral body. No worrisome osseous lesions. No abnormal marrow
edema.

Cord: Signal intensity within the cervical spinal cord is normal.

Posterior Fossa, vertebral arteries, paraspinal tissues: Visualized
brain and posterior fossa of demonstrates a somewhat sagging
appearance with flattening of the ventral pons and partial
effacement of the prepontine cistern. Cerebellar tonsils mildly low
lying in the foramen magnum. Craniocervical junction otherwise
normal. Paraspinous and prevertebral soft tissues within normal
limits. Normal intravascular flow voids present within the vertebral
arteries bilaterally.

Disc levels:

C2-C3: Unremarkable.

C3-C4:  Mild disc bulge.  No stenosis.

C4-C5: Minimal disc bulge with uncovertebral hypertrophy. No
stenosis.

C5-C6: Broad right paracentral disc osteophyte complex, extending
into the right neural foramen. Flattening of the ventral thecal sac,
greatest on the right with mild flattening of the right hemi cord.
Mild to moderate spinal stenosis. Moderate right C6 foraminal
stenosis.

C6-C7:  Minimal annular disc bulge.  No stenosis.

C7-T1:  Unremarkable.

Visualized upper thoracic spine demonstrates no significant finding.
IMPRESSION: 1. Right eccentric disc osteophyte complex at C5-6 with resultant
mild to moderate spinal stenosis, with moderate right C6 foraminal
narrowing.
2. Somewhat sagging appearance of the visualized brain and posterior
fossa, which can be seen in the setting of CNS hypotension. Clinical
correlation recommended.
3. Additional minor disc bulging elsewhere within the cervical spine
without significant stenosis.

## 2020-07-01 DIAGNOSIS — E785 Hyperlipidemia, unspecified: Secondary | ICD-10-CM | POA: Diagnosis not present

## 2020-07-01 DIAGNOSIS — Z23 Encounter for immunization: Secondary | ICD-10-CM | POA: Diagnosis not present

## 2020-07-01 DIAGNOSIS — Z0000 Encounter for general adult medical examination without abnormal findings: Secondary | ICD-10-CM | POA: Diagnosis not present

## 2020-07-01 DIAGNOSIS — Z5181 Encounter for therapeutic drug level monitoring: Secondary | ICD-10-CM | POA: Diagnosis not present

## 2020-07-01 DIAGNOSIS — E039 Hypothyroidism, unspecified: Secondary | ICD-10-CM | POA: Diagnosis not present

## 2020-09-07 DIAGNOSIS — J069 Acute upper respiratory infection, unspecified: Secondary | ICD-10-CM | POA: Diagnosis not present

## 2020-09-10 DIAGNOSIS — K1370 Unspecified lesions of oral mucosa: Secondary | ICD-10-CM | POA: Diagnosis not present

## 2020-09-29 DIAGNOSIS — U071 COVID-19: Secondary | ICD-10-CM | POA: Diagnosis not present

## 2020-10-04 DIAGNOSIS — U071 COVID-19: Secondary | ICD-10-CM | POA: Diagnosis not present

## 2020-10-15 DIAGNOSIS — E039 Hypothyroidism, unspecified: Secondary | ICD-10-CM | POA: Diagnosis not present

## 2021-07-21 DIAGNOSIS — Z01419 Encounter for gynecological examination (general) (routine) without abnormal findings: Secondary | ICD-10-CM | POA: Diagnosis not present

## 2021-07-21 DIAGNOSIS — Z124 Encounter for screening for malignant neoplasm of cervix: Secondary | ICD-10-CM | POA: Diagnosis not present

## 2021-07-21 DIAGNOSIS — Z1272 Encounter for screening for malignant neoplasm of vagina: Secondary | ICD-10-CM | POA: Diagnosis not present

## 2021-07-21 DIAGNOSIS — Z1231 Encounter for screening mammogram for malignant neoplasm of breast: Secondary | ICD-10-CM | POA: Diagnosis not present

## 2021-10-11 DIAGNOSIS — E039 Hypothyroidism, unspecified: Secondary | ICD-10-CM | POA: Diagnosis not present

## 2021-10-11 DIAGNOSIS — J309 Allergic rhinitis, unspecified: Secondary | ICD-10-CM | POA: Diagnosis not present

## 2021-10-11 DIAGNOSIS — Z23 Encounter for immunization: Secondary | ICD-10-CM | POA: Diagnosis not present

## 2021-10-11 DIAGNOSIS — L719 Rosacea, unspecified: Secondary | ICD-10-CM | POA: Diagnosis not present

## 2022-03-10 DIAGNOSIS — Z862 Personal history of diseases of the blood and blood-forming organs and certain disorders involving the immune mechanism: Secondary | ICD-10-CM | POA: Diagnosis not present

## 2022-03-10 DIAGNOSIS — E785 Hyperlipidemia, unspecified: Secondary | ICD-10-CM | POA: Diagnosis not present

## 2022-03-10 DIAGNOSIS — E039 Hypothyroidism, unspecified: Secondary | ICD-10-CM | POA: Diagnosis not present

## 2022-03-10 DIAGNOSIS — L237 Allergic contact dermatitis due to plants, except food: Secondary | ICD-10-CM | POA: Diagnosis not present

## 2022-03-10 DIAGNOSIS — L719 Rosacea, unspecified: Secondary | ICD-10-CM | POA: Diagnosis not present

## 2022-03-10 DIAGNOSIS — Z0000 Encounter for general adult medical examination without abnormal findings: Secondary | ICD-10-CM | POA: Diagnosis not present

## 2022-03-13 DIAGNOSIS — L237 Allergic contact dermatitis due to plants, except food: Secondary | ICD-10-CM | POA: Diagnosis not present

## 2022-03-27 DIAGNOSIS — J069 Acute upper respiratory infection, unspecified: Secondary | ICD-10-CM | POA: Diagnosis not present

## 2022-03-27 DIAGNOSIS — R52 Pain, unspecified: Secondary | ICD-10-CM | POA: Diagnosis not present

## 2022-03-29 DIAGNOSIS — R070 Pain in throat: Secondary | ICD-10-CM | POA: Diagnosis not present

## 2022-03-29 DIAGNOSIS — Z03818 Encounter for observation for suspected exposure to other biological agents ruled out: Secondary | ICD-10-CM | POA: Diagnosis not present

## 2022-03-29 DIAGNOSIS — R5383 Other fatigue: Secondary | ICD-10-CM | POA: Diagnosis not present

## 2022-04-13 DIAGNOSIS — J019 Acute sinusitis, unspecified: Secondary | ICD-10-CM | POA: Diagnosis not present

## 2022-07-28 DIAGNOSIS — Z01419 Encounter for gynecological examination (general) (routine) without abnormal findings: Secondary | ICD-10-CM | POA: Diagnosis not present

## 2022-07-28 DIAGNOSIS — Z1231 Encounter for screening mammogram for malignant neoplasm of breast: Secondary | ICD-10-CM | POA: Diagnosis not present

## 2022-09-12 DIAGNOSIS — E785 Hyperlipidemia, unspecified: Secondary | ICD-10-CM | POA: Diagnosis not present

## 2022-09-15 ENCOUNTER — Other Ambulatory Visit: Payer: Self-pay | Admitting: Family Medicine

## 2022-09-15 ENCOUNTER — Ambulatory Visit
Admission: RE | Admit: 2022-09-15 | Discharge: 2022-09-15 | Disposition: A | Discharge status: Home / Self Care | Payer: BC Managed Care – PPO | Source: Ambulatory Visit | Attending: Family Medicine | Admitting: Family Medicine

## 2022-09-15 DIAGNOSIS — R110 Nausea: Secondary | ICD-10-CM | POA: Diagnosis not present

## 2022-09-15 DIAGNOSIS — R350 Frequency of micturition: Secondary | ICD-10-CM | POA: Diagnosis not present

## 2022-09-15 DIAGNOSIS — G8929 Other chronic pain: Secondary | ICD-10-CM | POA: Diagnosis not present

## 2022-09-15 DIAGNOSIS — M546 Pain in thoracic spine: Secondary | ICD-10-CM

## 2022-09-15 DIAGNOSIS — M47814 Spondylosis without myelopathy or radiculopathy, thoracic region: Secondary | ICD-10-CM | POA: Diagnosis not present

## 2022-09-15 DIAGNOSIS — M545 Low back pain, unspecified: Secondary | ICD-10-CM | POA: Diagnosis not present

## 2022-11-20 DIAGNOSIS — M79672 Pain in left foot: Secondary | ICD-10-CM | POA: Diagnosis not present

## 2022-12-04 DIAGNOSIS — M79672 Pain in left foot: Secondary | ICD-10-CM | POA: Diagnosis not present

## 2022-12-25 DIAGNOSIS — M79672 Pain in left foot: Secondary | ICD-10-CM | POA: Diagnosis not present

## 2023-02-19 DIAGNOSIS — M79672 Pain in left foot: Secondary | ICD-10-CM | POA: Diagnosis not present

## 2023-02-19 DIAGNOSIS — M25572 Pain in left ankle and joints of left foot: Secondary | ICD-10-CM | POA: Diagnosis not present

## 2023-03-03 DIAGNOSIS — M79672 Pain in left foot: Secondary | ICD-10-CM | POA: Diagnosis not present

## 2023-03-08 DIAGNOSIS — M25572 Pain in left ankle and joints of left foot: Secondary | ICD-10-CM | POA: Diagnosis not present

## 2023-03-13 DIAGNOSIS — M7672 Peroneal tendinitis, left leg: Secondary | ICD-10-CM | POA: Diagnosis not present

## 2023-04-02 DIAGNOSIS — M79672 Pain in left foot: Secondary | ICD-10-CM | POA: Diagnosis not present

## 2023-04-09 DIAGNOSIS — M79672 Pain in left foot: Secondary | ICD-10-CM | POA: Diagnosis not present

## 2023-04-17 DIAGNOSIS — M79672 Pain in left foot: Secondary | ICD-10-CM | POA: Diagnosis not present

## 2023-04-27 DIAGNOSIS — M79672 Pain in left foot: Secondary | ICD-10-CM | POA: Diagnosis not present

## 2023-05-02 DIAGNOSIS — M79672 Pain in left foot: Secondary | ICD-10-CM | POA: Diagnosis not present

## 2023-05-10 DIAGNOSIS — M79672 Pain in left foot: Secondary | ICD-10-CM | POA: Diagnosis not present

## 2023-05-14 DIAGNOSIS — M79672 Pain in left foot: Secondary | ICD-10-CM | POA: Diagnosis not present

## 2023-05-17 DIAGNOSIS — E039 Hypothyroidism, unspecified: Secondary | ICD-10-CM | POA: Diagnosis not present

## 2023-05-17 DIAGNOSIS — E559 Vitamin D deficiency, unspecified: Secondary | ICD-10-CM | POA: Diagnosis not present

## 2023-05-17 DIAGNOSIS — Z23 Encounter for immunization: Secondary | ICD-10-CM | POA: Diagnosis not present

## 2023-05-17 DIAGNOSIS — Z0000 Encounter for general adult medical examination without abnormal findings: Secondary | ICD-10-CM | POA: Diagnosis not present

## 2023-05-17 DIAGNOSIS — J309 Allergic rhinitis, unspecified: Secondary | ICD-10-CM | POA: Diagnosis not present

## 2023-05-17 DIAGNOSIS — E785 Hyperlipidemia, unspecified: Secondary | ICD-10-CM | POA: Diagnosis not present

## 2023-05-24 DIAGNOSIS — M79672 Pain in left foot: Secondary | ICD-10-CM | POA: Diagnosis not present

## 2023-05-30 DIAGNOSIS — M79672 Pain in left foot: Secondary | ICD-10-CM | POA: Diagnosis not present

## 2023-06-05 DIAGNOSIS — M79672 Pain in left foot: Secondary | ICD-10-CM | POA: Diagnosis not present

## 2023-06-21 DIAGNOSIS — M7672 Peroneal tendinitis, left leg: Secondary | ICD-10-CM | POA: Diagnosis not present

## 2023-06-21 DIAGNOSIS — S93492A Sprain of other ligament of left ankle, initial encounter: Secondary | ICD-10-CM | POA: Diagnosis not present

## 2023-06-21 DIAGNOSIS — M7742 Metatarsalgia, left foot: Secondary | ICD-10-CM | POA: Diagnosis not present

## 2023-06-21 DIAGNOSIS — M79672 Pain in left foot: Secondary | ICD-10-CM | POA: Diagnosis not present

## 2023-07-05 DIAGNOSIS — M79672 Pain in left foot: Secondary | ICD-10-CM | POA: Diagnosis not present

## 2023-07-12 DIAGNOSIS — M7672 Peroneal tendinitis, left leg: Secondary | ICD-10-CM | POA: Diagnosis not present

## 2023-07-12 DIAGNOSIS — M7742 Metatarsalgia, left foot: Secondary | ICD-10-CM | POA: Diagnosis not present

## 2023-07-12 DIAGNOSIS — S93492A Sprain of other ligament of left ankle, initial encounter: Secondary | ICD-10-CM | POA: Diagnosis not present

## 2023-08-16 DIAGNOSIS — S93492A Sprain of other ligament of left ankle, initial encounter: Secondary | ICD-10-CM | POA: Diagnosis not present

## 2023-08-16 DIAGNOSIS — M7742 Metatarsalgia, left foot: Secondary | ICD-10-CM | POA: Diagnosis not present

## 2023-08-16 DIAGNOSIS — M7672 Peroneal tendinitis, left leg: Secondary | ICD-10-CM | POA: Diagnosis not present

## 2023-08-21 DIAGNOSIS — M79672 Pain in left foot: Secondary | ICD-10-CM | POA: Diagnosis not present

## 2023-08-27 DIAGNOSIS — S93492A Sprain of other ligament of left ankle, initial encounter: Secondary | ICD-10-CM | POA: Diagnosis not present

## 2023-08-27 DIAGNOSIS — M7672 Peroneal tendinitis, left leg: Secondary | ICD-10-CM | POA: Diagnosis not present

## 2023-08-27 DIAGNOSIS — M7742 Metatarsalgia, left foot: Secondary | ICD-10-CM | POA: Diagnosis not present

## 2023-08-29 DIAGNOSIS — M7742 Metatarsalgia, left foot: Secondary | ICD-10-CM | POA: Diagnosis not present

## 2023-08-29 DIAGNOSIS — M79671 Pain in right foot: Secondary | ICD-10-CM | POA: Diagnosis not present

## 2023-10-25 DIAGNOSIS — Z01419 Encounter for gynecological examination (general) (routine) without abnormal findings: Secondary | ICD-10-CM | POA: Diagnosis not present

## 2023-10-25 DIAGNOSIS — Z1231 Encounter for screening mammogram for malignant neoplasm of breast: Secondary | ICD-10-CM | POA: Diagnosis not present

## 2024-05-08 DIAGNOSIS — Z20822 Contact with and (suspected) exposure to covid-19: Secondary | ICD-10-CM | POA: Diagnosis not present

## 2024-05-08 DIAGNOSIS — R051 Acute cough: Secondary | ICD-10-CM | POA: Diagnosis not present

## 2024-05-27 DIAGNOSIS — E559 Vitamin D deficiency, unspecified: Secondary | ICD-10-CM | POA: Diagnosis not present

## 2024-05-27 DIAGNOSIS — Z0000 Encounter for general adult medical examination without abnormal findings: Secondary | ICD-10-CM | POA: Diagnosis not present

## 2024-05-27 DIAGNOSIS — E039 Hypothyroidism, unspecified: Secondary | ICD-10-CM | POA: Diagnosis not present

## 2024-05-27 DIAGNOSIS — E785 Hyperlipidemia, unspecified: Secondary | ICD-10-CM | POA: Diagnosis not present

## 2024-10-31 ENCOUNTER — Other Ambulatory Visit: Payer: Self-pay | Admitting: Obstetrics and Gynecology

## 2024-10-31 DIAGNOSIS — R928 Other abnormal and inconclusive findings on diagnostic imaging of breast: Secondary | ICD-10-CM

## 2024-11-11 ENCOUNTER — Encounter

## 2024-11-11 ENCOUNTER — Other Ambulatory Visit
